# Patient Record
Sex: Female | Born: 1985
Health system: Southern US, Community
[De-identification: ages and names within clinical notes are randomized; demographics above are authoritative.]

## PROBLEM LIST (undated history)

## (undated) DIAGNOSIS — F419 Anxiety disorder, unspecified: Secondary | ICD-10-CM

## (undated) DIAGNOSIS — T7840XA Allergy, unspecified, initial encounter: Secondary | ICD-10-CM

## (undated) HISTORY — DX: Anxiety disorder, unspecified: F41.9

## (undated) HISTORY — DX: Allergy, unspecified, initial encounter: T78.40XA

## (undated) HISTORY — PX: NO PAST SURGERIES: SHX2092

---
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
## 2008-12-27 ENCOUNTER — Ambulatory Visit: Payer: Self-pay | Admitting: Internal Medicine

## 2008-12-27 DIAGNOSIS — R197 Diarrhea, unspecified: Secondary | ICD-10-CM | POA: Insufficient documentation

## 2008-12-27 DIAGNOSIS — R1084 Generalized abdominal pain: Secondary | ICD-10-CM | POA: Insufficient documentation

## 2008-12-29 LAB — CONVERTED CEMR LAB
ALT: 13 units/L (ref 0–35)
AST: 19 units/L (ref 0–37)
BUN: 16 mg/dL (ref 6–23)
Bilirubin, Direct: 0.1 mg/dL (ref 0.0–0.3)
CO2: 27 meq/L (ref 19–32)
Chloride: 108 meq/L (ref 96–112)
Eosinophils Relative: 2.3 % (ref 0.0–5.0)
HCT: 40 % (ref 36.0–46.0)
MCHC: 33.6 g/dL (ref 30.0–36.0)
MCV: 87.6 fL (ref 78.0–100.0)
Platelets: 236 10*3/uL (ref 150.0–400.0)
Potassium: 3.8 meq/L (ref 3.5–5.1)
TSH: 1.45 microintl units/mL (ref 0.35–5.50)
Total Bilirubin: 0.8 mg/dL (ref 0.3–1.2)
Total Protein: 7.3 g/dL (ref 6.0–8.3)
WBC: 6.9 10*3/uL (ref 4.5–10.5)

## 2009-01-12 ENCOUNTER — Ambulatory Visit: Payer: Self-pay | Admitting: Internal Medicine

## 2009-01-12 LAB — CONVERTED CEMR LAB
Fecal Occult Blood: NEGATIVE
OCCULT 2: NEGATIVE
OCCULT 3: NEGATIVE
OCCULT 5: NEGATIVE

## 2009-01-26 ENCOUNTER — Ambulatory Visit: Payer: Self-pay | Admitting: Internal Medicine

## 2009-01-26 DIAGNOSIS — K589 Irritable bowel syndrome without diarrhea: Secondary | ICD-10-CM | POA: Insufficient documentation

## 2013-04-25 ENCOUNTER — Ambulatory Visit (INDEPENDENT_AMBULATORY_CARE_PROVIDER_SITE_OTHER): Payer: BC Managed Care – PPO | Admitting: Family Medicine

## 2013-04-25 VITALS — BP 102/75 | HR 66 | Temp 98.5°F | Resp 18 | Ht 63.0 in | Wt 126.0 lb

## 2013-04-25 DIAGNOSIS — H109 Unspecified conjunctivitis: Secondary | ICD-10-CM

## 2013-04-25 MED ORDER — OFLOXACIN 0.3 % OP SOLN: 2.0000 [drp] | Freq: Four times a day (QID) | OPHTHALMIC | Status: DC

## 2013-04-25 NOTE — Progress Notes (Signed)
[address]   [place], [place]  [address]   [phone]  Subjective:    Patient ID: [name], female    DOB: 08/07/1986, 28 y.o.   MRN: [id number]  HPI This 27 y.o. female presents for evaluation of L eye pain.  Awoke this morning with L pink eye with thick yellow mucous.  No fever/chills/sweats.  No sore throat, rhinorrhea.  Eye feels dry and irritated; no burning.  No excessive tearing.  Feels weird; feels thick.  No foreign body sensation.  No photophobia.  Normal vision.  Wears contacts; took contact out before bedtime.  No pink eye exposures. Works in Airline pilot.    PCP:  Lorain Childes at [facility]   Review of Systems  Constitutional: Negative for chills, diaphoresis and fatigue.  HENT: Positive for rhinorrhea and sneezing. Negative for ear pain and sore throat.   Eyes: Positive for discharge and redness. Negative for photophobia, pain, itching and visual disturbance.  Respiratory: Negative for cough.   Hematological: Negative for adenopathy.   Past Medical History  Diagnosis Date  . Allergy   . Anxiety    No Known Allergies No current outpatient prescriptions on file prior to visit.   No current facility-administered medications on file prior to visit.   History   Social History  . Marital Status: Married    Spouse Name: N/A    Number of Children: N/A  . Years of Education: N/A   Occupational History  . Not on file.   Social History Main Topics  . Smoking status: Never Smoker   . Smokeless tobacco: Not on file  . Alcohol Use: Yes  . Drug Use: No  . Sexual Activity: Yes    Birth Control/ Protection: Pill   Other Topics Concern  . Not on file   Social History Narrative  . No narrative on file       Objective:   Physical Exam  Nursing note and vitals reviewed. Constitutional: She appears well-developed and well-nourished. No distress.  HENT:  Head: Normocephalic and atraumatic.  Mouth/Throat: Oropharynx is clear and moist.  Eyes: EOM are  normal. Pupils are equal, round, and reactive to light. Right eye exhibits no discharge. Left eye exhibits discharge. Right conjunctiva is not injected. Right conjunctiva has no hemorrhage. Left conjunctiva is injected. Left conjunctiva has no hemorrhage. No scleral icterus. Left eye exhibits normal extraocular motion.  Fundoscopic exam:      The right eye shows red reflex.       The left eye shows exudate. The left eye shows red reflex.  Slit lamp exam:      The right eye shows no fluorescein uptake.       The left eye shows no corneal abrasion, no corneal ulcer and no foreign body.  Neurological: She is alert.  Skin: She is not diaphoretic.  Psychiatric: She has a normal mood and affect. Her behavior is normal.       Assessment & Plan:  Conjunctivitis, left eye   1.  L conjunctivitis:  New. Rx for Ocuflox provided to use qid.  No contact lens while eye is red.  RTC for pain, FB sensation, blurred vision.  Meds ordered this encounter  Medications  . norethindrone-ethinyl estradiol (JUNEL FE,GILDESS FE,LOESTRIN FE) 1-20 MG-MCG tablet    Sig: Take 1 tablet by mouth daily.  Marland Kitchen ofloxacin (OCUFLOX) 0.3 % ophthalmic solution    Sig: Place 2 drops into the left eye 4 (four) times daily.    Dispense:  10 mL    Refill:  0

## 2013-04-25 NOTE — Patient Instructions (Addendum)
1.  No contacts while eye is red. 2.  Return to office for development of pain, blurred vision, foreign body sensation.  Conjunctivitis Conjunctivitis is commonly called "pink eye." Conjunctivitis can be caused by bacterial or viral infection, allergies, or injuries. There is usually redness of the lining of the eye, itching, discomfort, and sometimes discharge. There may be deposits of matter along the eyelids. A viral infection usually causes a watery discharge, while a bacterial infection causes a yellowish, thick discharge. Pink eye is very contagious and spreads by direct contact. You may be given antibiotic eyedrops as part of your treatment. Before using your eye medicine, remove all drainage from the eye by washing gently with warm water and cotton balls. Continue to use the medication until you have awakened 2 mornings in a row without discharge from the eye. Do not rub your eye. This increases the irritation and helps spread infection. Use separate towels from other household members. Wash your hands with soap and water before and after touching your eyes. Use cold compresses to reduce pain and sunglasses to relieve irritation from light. Do not wear contact lenses or wear eye makeup until the infection is gone. SEEK MEDICAL CARE IF:   Your symptoms are not better after 3 days of treatment.  You have increased pain or trouble seeing.  The outer eyelids become very red or swollen. Document Released: 09/27/2004 Document Revised: 11/12/2011 Document Reviewed: 08/20/2005 [facility] Patient Information 2014 [place], [place].

## 2015-02-14 LAB — OB RESULTS CONSOLE ABO/RH: RH TYPE: POSITIVE

## 2015-02-14 LAB — OB RESULTS CONSOLE RPR: RPR: NONREACTIVE

## 2015-02-14 LAB — OB RESULTS CONSOLE RUBELLA ANTIBODY, IGM: Rubella: NON-IMMUNE/NOT IMMUNE

## 2015-02-14 LAB — OB RESULTS CONSOLE HIV ANTIBODY (ROUTINE TESTING): HIV: NONREACTIVE

## 2015-02-14 LAB — OB RESULTS CONSOLE HEPATITIS B SURFACE ANTIGEN: Hepatitis B Surface Ag: NEGATIVE

## 2015-02-14 LAB — OB RESULTS CONSOLE ANTIBODY SCREEN: Antibody Screen: NEGATIVE

## 2015-02-28 LAB — OB RESULTS CONSOLE GC/CHLAMYDIA
CHLAMYDIA, DNA PROBE: NEGATIVE
GC PROBE AMP, GENITAL: NEGATIVE

## 2015-08-23 LAB — OB RESULTS CONSOLE GBS: STREP GROUP B AG: NEGATIVE

## 2015-09-04 NOTE — L&D Delivery Note (Signed)
Delivery Note At 10:56 AM a viable and healthy female was delivered via Vaginal, Spontaneous Delivery (Presentation: ; Occiput Anterior).  APGAR: 9, 9; weight  pending.   Placenta status: Intact, Spontaneous.  Cord: 3 vessels with the following complications: None.  Cord pH: na  Anesthesia: Epidural  Episiotomy: None Lacerations:  second Suture Repair: 2.0 3.0 vicryl rapide Est. Blood Loss (mL):    Mom to postpartum.  Baby to Couplet care / Skin to Skin.  Boen Sterbenz J 09/18/2015, 11:19 AM

## 2015-09-18 ENCOUNTER — Inpatient Hospital Stay (HOSPITAL_COMMUNITY): Payer: BLUE CROSS/BLUE SHIELD | Admitting: Anesthesiology

## 2015-09-18 ENCOUNTER — Inpatient Hospital Stay (HOSPITAL_COMMUNITY)
Admission: AD | Admit: 2015-09-18 | Discharge: 2015-09-20 | DRG: 775 | Disposition: A | Discharge status: Home / Self Care | Payer: BLUE CROSS/BLUE SHIELD | Source: Ambulatory Visit | Attending: Obstetrics and Gynecology | Admitting: Obstetrics and Gynecology

## 2015-09-18 ENCOUNTER — Encounter (HOSPITAL_COMMUNITY): Payer: Self-pay

## 2015-09-18 DIAGNOSIS — Z3A39 39 weeks gestation of pregnancy: Secondary | ICD-10-CM | POA: Diagnosis not present

## 2015-09-18 LAB — CBC
HCT: 36.1 % (ref 36.0–46.0)
Hemoglobin: 12.2 g/dL (ref 12.0–15.0)
MCH: 27.1 pg (ref 26.0–34.0)
MCHC: 33.8 g/dL (ref 30.0–36.0)
MCV: 80 fL (ref 78.0–100.0)
PLATELETS: 282 10*3/uL (ref 150–400)
RBC: 4.51 MIL/uL (ref 3.87–5.11)
RDW: 14 % (ref 11.5–15.5)
WBC: 10.5 10*3/uL (ref 4.0–10.5)

## 2015-09-18 LAB — ABO/RH: ABO/RH(D): O POS

## 2015-09-18 LAB — RPR: RPR Ser Ql: NONREACTIVE

## 2015-09-18 LAB — TYPE AND SCREEN
ABO/RH(D): O POS
ANTIBODY SCREEN: NEGATIVE

## 2015-09-18 MED ORDER — OXYCODONE-ACETAMINOPHEN 5-325 MG PO TABS: 1.0000 | ORAL_TABLET | ORAL | Status: DC | PRN

## 2015-09-18 MED ORDER — LIDOCAINE HCL (PF) 1 % IJ SOLN
30.0000 mL | INTRAMUSCULAR | Status: DC | PRN
  Filled 2015-09-18: qty 30

## 2015-09-18 MED ORDER — FENTANYL 2.5 MCG/ML BUPIVACAINE 1/10 % EPIDURAL INFUSION (WH - ANES)
INTRAMUSCULAR | Status: AC
  Filled 2015-09-18: qty 125

## 2015-09-18 MED ORDER — ACETAMINOPHEN 325 MG PO TABS: 650.0000 mg | ORAL_TABLET | ORAL | Status: DC | PRN

## 2015-09-18 MED ORDER — METHYLERGONOVINE MALEATE 0.2 MG/ML IJ SOLN: 0.2000 mg | INTRAMUSCULAR | Status: DC | PRN

## 2015-09-18 MED ORDER — LIDOCAINE HCL (PF) 1 % IJ SOLN
INTRAMUSCULAR | Status: DC | PRN
  Administered 2015-09-18: 3 mL via EPIDURAL
  Administered 2015-09-18: 2 mL via EPIDURAL
  Administered 2015-09-18: 5 mL via EPIDURAL

## 2015-09-18 MED ORDER — LACTATED RINGERS IV SOLN
2.5000 [IU]/h | INTRAVENOUS | Status: DC
  Filled 2015-09-18: qty 4

## 2015-09-18 MED ORDER — FENTANYL 2.5 MCG/ML BUPIVACAINE 1/10 % EPIDURAL INFUSION (WH - ANES)
14.0000 mL/h | INTRAMUSCULAR | Status: DC | PRN
Start: 2015-09-18 — End: 2015-09-18
  Administered 2015-09-18: 14 mL/h via EPIDURAL

## 2015-09-18 MED ORDER — SENNOSIDES-DOCUSATE SODIUM 8.6-50 MG PO TABS
2.0000 | ORAL_TABLET | ORAL | Status: DC
  Administered 2015-09-19: 2 via ORAL
  Filled 2015-09-18 (×2): qty 2

## 2015-09-18 MED ORDER — PRENATAL MULTIVITAMIN CH
1.0000 | ORAL_TABLET | Freq: Every day | ORAL | Status: DC
  Administered 2015-09-18 – 2015-09-20 (×3): 1 via ORAL
  Filled 2015-09-18 (×3): qty 1

## 2015-09-18 MED ORDER — CITRIC ACID-SODIUM CITRATE 334-500 MG/5ML PO SOLN: 30.0000 mL | ORAL | Status: DC | PRN

## 2015-09-18 MED ORDER — ONDANSETRON HCL 4 MG/2ML IJ SOLN: 4.0000 mg | Freq: Four times a day (QID) | INTRAMUSCULAR | Status: DC | PRN

## 2015-09-18 MED ORDER — SIMETHICONE 80 MG PO CHEW: 80.0000 mg | CHEWABLE_TABLET | ORAL | Status: DC | PRN

## 2015-09-18 MED ORDER — IBUPROFEN 600 MG PO TABS
600.0000 mg | ORAL_TABLET | Freq: Four times a day (QID) | ORAL | Status: DC
  Administered 2015-09-18 – 2015-09-20 (×8): 600 mg via ORAL
  Filled 2015-09-18 (×9): qty 1

## 2015-09-18 MED ORDER — LACTATED RINGERS IV SOLN: 500.0000 mL | INTRAVENOUS | Status: DC | PRN

## 2015-09-18 MED ORDER — METHYLERGONOVINE MALEATE 0.2 MG PO TABS: 0.2000 mg | ORAL_TABLET | ORAL | Status: DC | PRN

## 2015-09-18 MED ORDER — FLEET ENEMA 7-19 GM/118ML RE ENEM: 1.0000 | ENEMA | RECTAL | Status: DC | PRN

## 2015-09-18 MED ORDER — ONDANSETRON HCL 4 MG/2ML IJ SOLN: 4.0000 mg | INTRAMUSCULAR | Status: DC | PRN

## 2015-09-18 MED ORDER — EPHEDRINE 5 MG/ML INJ
10.0000 mg | INTRAVENOUS | Status: DC | PRN
Start: 2015-09-18 — End: 2015-09-18
  Filled 2015-09-18: qty 2

## 2015-09-18 MED ORDER — DIBUCAINE 1 % RE OINT: 1.0000 "application " | TOPICAL_OINTMENT | RECTAL | Status: DC | PRN

## 2015-09-18 MED ORDER — ZOLPIDEM TARTRATE 5 MG PO TABS: 5.0000 mg | ORAL_TABLET | Freq: Every evening | ORAL | Status: DC | PRN

## 2015-09-18 MED ORDER — LACTATED RINGERS IV SOLN
INTRAVENOUS | Status: DC
  Administered 2015-09-18: 06:00:00 via INTRAVENOUS

## 2015-09-18 MED ORDER — LANOLIN HYDROUS EX OINT: TOPICAL_OINTMENT | CUTANEOUS | Status: DC | PRN

## 2015-09-18 MED ORDER — PHENYLEPHRINE 40 MCG/ML (10ML) SYRINGE FOR IV PUSH (FOR BLOOD PRESSURE SUPPORT)
PREFILLED_SYRINGE | INTRAVENOUS | Status: AC
  Filled 2015-09-18: qty 10

## 2015-09-18 MED ORDER — ONDANSETRON HCL 4 MG PO TABS: 4.0000 mg | ORAL_TABLET | ORAL | Status: DC | PRN

## 2015-09-18 MED ORDER — PHENYLEPHRINE 40 MCG/ML (10ML) SYRINGE FOR IV PUSH (FOR BLOOD PRESSURE SUPPORT)
80.0000 ug | PREFILLED_SYRINGE | INTRAVENOUS | Status: DC | PRN
  Filled 2015-09-18: qty 2

## 2015-09-18 MED ORDER — WITCH HAZEL-GLYCERIN EX PADS: 1.0000 "application " | MEDICATED_PAD | CUTANEOUS | Status: DC | PRN

## 2015-09-18 MED ORDER — OXYCODONE-ACETAMINOPHEN 5-325 MG PO TABS: 2.0000 | ORAL_TABLET | ORAL | Status: DC | PRN

## 2015-09-18 MED ORDER — BENZOCAINE-MENTHOL 20-0.5 % EX AERO
1.0000 "application " | INHALATION_SPRAY | CUTANEOUS | Status: DC | PRN
  Administered 2015-09-18: 1 via TOPICAL
  Filled 2015-09-18 (×2): qty 56

## 2015-09-18 MED ORDER — TETANUS-DIPHTH-ACELL PERTUSSIS 5-2.5-18.5 LF-MCG/0.5 IM SUSP: 0.5000 mL | Freq: Once | INTRAMUSCULAR | Status: DC

## 2015-09-18 MED ORDER — DIPHENHYDRAMINE HCL 50 MG/ML IJ SOLN: 12.5000 mg | INTRAMUSCULAR | Status: DC | PRN

## 2015-09-18 MED ORDER — OXYTOCIN BOLUS FROM INFUSION
500.0000 mL | INTRAVENOUS | Status: DC
  Administered 2015-09-18: 500 mL via INTRAVENOUS

## 2015-09-18 MED ORDER — DIPHENHYDRAMINE HCL 25 MG PO CAPS
25.0000 mg | ORAL_CAPSULE | Freq: Four times a day (QID) | ORAL | Status: DC | PRN
Start: 2015-09-18 — End: 2015-09-20

## 2015-09-18 NOTE — Progress Notes (Signed)
Patient ID: [name], female   DOB: Nov 18, 1985, 30 y.o.   MRN: [id number] Called for FHR deceleration Noted a questionable period where FHT in 6190s FHT category 1 prior to decel Currently category 1 tracing with BTBV >5 and accels Will admit for routine labor management and get epidural

## 2015-09-18 NOTE — Anesthesia Postprocedure Evaluation (Signed)
Anesthesia Post Note  Patient: [name]  Procedure(s) Performed: * No procedures listed *  Patient location during evaluation: Mother Baby Anesthesia Type: Epidural Level of consciousness: awake Pain management: pain level controlled Vital Signs Assessment: post-procedure vital signs reviewed and stable Respiratory status: spontaneous breathing Cardiovascular status: stable Postop Assessment: no headache, no backache, epidural receding, patient able to bend at knees, adequate PO intake and no signs of nausea or vomiting Anesthetic complications: no    Last Vitals:  Filed Vitals:   09/18/15 1300 09/18/15 1430  BP: 114/57 118/62  Pulse: 57 72  Temp: 36.7 C 37.4 C  Resp: 18 20    Last Pain:  Filed Vitals:   09/18/15 1458  PainSc: 2                  Zurich Carreno

## 2015-09-18 NOTE — MAU Note (Signed)
Was 4 cm at office on Wednesday.  Started contracting few hours ago, every 10 min , then 5 min, closer now.  Blood tinge mucus.  Baby moving well.  No leaking.

## 2015-09-18 NOTE — Anesthesia Procedure Notes (Signed)
Epidural Patient location during procedure: OB  Staffing Anesthesiologist: Tieler Cournoyer EDWARD Performed by: anesthesiologist   Preanesthetic Checklist Completed: patient identified, pre-op evaluation, timeout performed, IV checked, risks and benefits discussed and monitors and equipment checked  Epidural Patient position: sitting Prep: DuraPrep Patient monitoring: blood pressure and continuous pulse ox Approach: midline Location: L3-L4 Injection technique: LOR air  Needle:  Needle type: Tuohy  Needle gauge: 17 G Needle length: 9 cm Needle insertion depth: 6 cm Catheter size: 19 Gauge Catheter at skin depth: 10 cm Test dose: negative and Other (1% Lidocaine)  Additional Notes Patient identified.  Risk benefits discussed including failed block, incomplete pain control, headache, nerve damage, paralysis, blood pressure changes, nausea, vomiting, reactions to medication both toxic or allergic, and postpartum back pain.  Patient expressed understanding and wished to proceed.  All questions were answered.  Sterile technique used throughout procedure and epidural site dressed with sterile barrier dressing. No paresthesia or other complications noted. The patient did not experience any signs of intravascular injection such as tinnitus or metallic taste in mouth nor signs of intrathecal spread such as rapid motor block. Please see nursing notes for vital signs. Reason for block:procedure for pain   

## 2015-09-18 NOTE — Anesthesia Preprocedure Evaluation (Signed)
Anesthesia Evaluation  Patient identified by MRN, date of birth, ID band Patient awake    Reviewed: Allergy & Precautions, NPO status , Patient's Chart, lab work & pertinent test results  Airway Mallampati: II  TM Distance: >3 FB Neck ROM: Full    Dental  (+) Teeth Intact, Dental Advisory Given   Pulmonary neg pulmonary ROS,    Pulmonary exam normal breath sounds clear to auscultation       Cardiovascular Exercise Tolerance: Good negative cardio ROS Normal cardiovascular exam Rhythm:Regular Rate:Normal     Neuro/Psych PSYCHIATRIC DISORDERS Anxiety negative neurological ROS     GI/Hepatic negative GI ROS, Neg liver ROS,   Endo/Other  Obesity   Renal/GU negative Renal ROS     Musculoskeletal scoliosis   Abdominal   Peds  Hematology negative hematology ROS (+) Plt 282k   Anesthesia Other Findings Day of surgery medications reviewed with the patient.  Reproductive/Obstetrics (+) Pregnancy                             Anesthesia Physical Anesthesia Plan  ASA: II  Anesthesia Plan: Epidural   Post-op Pain Management:    Induction:   Airway Management Planned:   Additional Equipment:   Intra-op Plan:   Post-operative Plan:   Informed Consent: I have reviewed the patients History and Physical, chart, labs and discussed the procedure including the risks, benefits and alternatives for the proposed anesthesia with the patient or authorized representative who has indicated his/her understanding and acceptance.   Dental advisory given  Plan Discussed with:   Anesthesia Plan Comments: (Patient identified. Risks/Benefits/Options discussed with patient including but not limited to bleeding, infection, nerve damage, paralysis, failed block, incomplete pain control, headache, blood pressure changes, nausea, vomiting, reactions to medication both or allergic, itching and postpartum back pain.  Confirmed with bedside nurse the patient's most recent platelet count. Confirmed with patient that they are not currently taking any anticoagulation, have any bleeding history or any family history of bleeding disorders. Patient expressed understanding and wished to proceed. All questions were answered. )        Anesthesia Quick Evaluation

## 2015-09-18 NOTE — Lactation Note (Signed)
This note was copied from the chart of [name] Mcmaster. Lactation Consultation Note  Patient Name: [name] Jimmerson RUEAV'WToday's Date: 09/18/2015 Reason for consult: Initial assessment  Baby 4 hours old. Mom reports that baby is hard to latch. Assisted mom with positioning herself using pillows and latching baby in football position. Demonstrated hand expression with no colostrum present. Discussed normal progression of milk coming to volume. Demonstrated the "teacup" hold for her nipple, and baby latched deeply, suckling rhythmically with no swallows noted. Baby only suckled for 4 minutes, and then fell asleep. Demonstrated how to perform chin tug to flange lower lip and discussed with parents that baby tired and transitioning at this point. Enc mom to keep baby on her chest STS, and to continue to offer lots of STS and nurse with cues.   Parents given LC brochure, aware of OP/BFSG and LC phone line assistance after D/C.  Maternal Data    Feeding Feeding Type: Breast Fed Length of feed: 4 min  LATCH Score/Interventions Latch: Grasps breast easily, tongue down, lips flanged, rhythmical sucking.  Audible Swallowing: None Intervention(s): Skin to skin;Hand expression  Type of Nipple: Flat Intervention(s): No intervention needed  Comfort (Breast/Nipple): Soft / non-tender     Hold (Positioning): Assistance needed to correctly position infant at breast and maintain latch.  LATCH Score: 6  Lactation Tools Discussed/Used     Consult Status Consult Status: Follow-up Date: 09/19/15 Follow-up type: In-patient    Geralynn OchsWILLIARD, Keithan Dileonardo 09/18/2015, 3:21 PM

## 2015-09-18 NOTE — Progress Notes (Signed)
Byrd HesselbachJulia M Liles is a 30 y.o. G1P0 at 3762w1d by LMP admitted for active labor  Subjective: occ pressure  Objective: BP 112/65 mmHg  Pulse 62  Temp(Src) 98.4 F (36.9 C) (Oral)  Ht 5\' 2"  (1.575 m)  Wt 76.658 kg (169 lb)  BMI 30.90 kg/m2  SpO2 100%      FHT:  FHR: 125 bpm, variability: moderate,  accelerations:  Present,  decelerations:  Absent UC:   regular, every 3 minutes SVE:   Dilation: 10 Effacement (%): 100 Station: 0, +1 Exam by:: patti moore rn  Labs: Lab Results  Component Value Date   WBC 10.5 09/18/2015   HGB 12.2 09/18/2015   HCT 36.1 09/18/2015   MCV 80.0 09/18/2015   PLT 282 09/18/2015    Assessment / Plan: Spontaneous labor, progressing normally  Labor: Progressing normally Preeclampsia:  no signs or symptoms of toxicity Fetal Wellbeing:  Category I Pain Control:  Epidural I/D:  n/a Anticipated MOD:  NSVD  Christia Domke J 09/18/2015, 8:48 AM

## 2015-09-18 NOTE — H&P (Signed)
[name] is a 30 y.o. female presenting for labor evaluation.  Maternal Medical History:  Reason for admission: Contractions.   Contractions: Onset was 1-2 hours ago.   Frequency: regular.   Perceived severity is moderate.    Fetal activity: Perceived fetal activity is normal.   Last perceived fetal movement was within the past hour.    Prenatal complications: no prenatal complications Prenatal Complications - Diabetes: none.    OB History    Gravida Para Term Preterm AB TAB SAB Ectopic Multiple Living   1              Past Medical History  Diagnosis Date  . Allergy   . Anxiety    Past Surgical History  Procedure Laterality Date  . No past surgeries     Family History: family history includes Glaucoma in her father; Hepatitis C in her father. Social History:  reports that she has never smoked. She does not have any smokeless tobacco history on file. She reports that she drinks alcohol. She reports that she does not use illicit drugs.   Prenatal Transfer Tool  Maternal Diabetes: No Genetic Screening: Normal Maternal Ultrasounds/Referrals: Normal Fetal Ultrasounds or other Referrals:  None Maternal Substance Abuse:  No Significant Maternal Medications:  None Significant Maternal Lab Results:  None Other Comments:  None  Review of Systems  Constitutional: Negative.   All other systems reviewed and are negative.     There were no vitals taken for this visit. Maternal Exam:  Uterine Assessment: Contraction strength is moderate.  Contraction frequency is irregular.   Abdomen: Patient reports no abdominal tenderness. Fetal presentation: vertex  Introitus: Normal vulva. Normal vagina.  Ferning test: negative.  Nitrazine test: negative. Amniotic fluid character: not assessed.  Pelvis: adequate for delivery.   Cervix: Cervix evaluated by digital exam.     Physical Exam  Nursing note and vitals reviewed. Constitutional: She appears well-developed and  well-nourished.  HENT:  Head: Normocephalic and atraumatic.  Neck: Normal range of motion. Neck supple.  Cardiovascular: Normal rate and regular rhythm.   Respiratory: Effort normal and breath sounds normal.  GI: Soft. Bowel sounds are normal.  Genitourinary: Vagina normal and uterus normal.  Musculoskeletal: Normal range of motion.  Skin: Skin is warm and dry.  Psychiatric: She has a normal mood and affect.    Prenatal labs: ABO, Rh: O/Positive/-- (06/13 0000) Antibody: Negative (06/13 0000) Rubella: Nonimmune (06/13 0000) RPR: Nonreactive (06/13 0000)  HBsAg: Negative (06/13 0000)  HIV: Non-reactive (06/13 0000)  GBS: Negative (12/20 0000)   Assessment/Plan: Term IUP Active Labor Admit Epidural   Deano Tomaszewski J 09/18/2015, 6:11 AM

## 2015-09-19 LAB — CBC
HCT: 31.2 % — ABNORMAL LOW (ref 36.0–46.0)
HEMOGLOBIN: 10.4 g/dL — AB (ref 12.0–15.0)
MCH: 26.9 pg (ref 26.0–34.0)
MCHC: 33.3 g/dL (ref 30.0–36.0)
MCV: 80.6 fL (ref 78.0–100.0)
PLATELETS: 261 10*3/uL (ref 150–400)
RBC: 3.87 MIL/uL (ref 3.87–5.11)
RDW: 14.4 % (ref 11.5–15.5)
WBC: 14 10*3/uL — AB (ref 4.0–10.5)

## 2015-09-19 NOTE — Lactation Note (Signed)
This note was copied from the chart of [name]. Lactation Consultation Note Baby is 2227 hours old and has not latched to breast more than a few minutes.  He had circumcision done 2 hours ago and is very sleepy.  Mom can hand express a drop of colostrum.  Baby holds breast in mouth with no suck elicited.  20 mm nipple shield applied and baby did suck on and off for 5 minutes.  A few swallows noted and one drop of colostrum in shield.  Nipple pinched slightly so 24 mm shield brought for next feeding.  DEBP initiated.  Instructed mom to give small amounts back by finger or call for assist with foley cup if more obtained.  Mom knows to feed with cues and post pump/hand express every 3 hours.  Encouraged to call with concerns/assist prn.  Patient Name: Jessica Charlette CaffeyJulia Clewis ZOXWR'UToday's Date: 09/19/2015 Reason for consult: Follow-up assessment;Difficult latch   Maternal Data    Feeding Feeding Type: Breast Fed Length of feed: 5 min  LATCH Score/Interventions Latch: Repeated attempts needed to sustain latch, nipple held in mouth throughout feeding, stimulation needed to elicit sucking reflex. Intervention(s): Adjust position;Assist with latch;Breast massage;Breast compression  Audible Swallowing: A few with stimulation  Type of Nipple: Flat  Comfort (Breast/Nipple): Soft / non-tender     Hold (Positioning): Assistance needed to correctly position infant at breast and maintain latch. Intervention(s): Breastfeeding basics reviewed;Support Pillows;Position options;Skin to skin  LATCH Score: 6  Lactation Tools Discussed/Used Tools: Nipple Shields Nipple shield size: 20 Pump Review: Setup, frequency, and cleaning;Milk Storage Initiated by:: LC Date initiated:: 09/19/15   Consult Status Consult Status: Follow-up Date: 09/20/15 Follow-up type: In-patient    Huston FoleyMOULDEN, Rozell Theiler S 09/19/2015, 2:56 PM

## 2015-09-19 NOTE — Progress Notes (Signed)
MOB was referred for history of depression/anxiety.  Referral is screened out by Clinical Social Worker because none of the following criteria appear to apply: -History of anxiety/depression during this pregnancy, or of post-partum depression. - Diagnosis of anxiety and/or depression within last 3 years (since prior to 2010) or -MOB's symptoms are currently being treated with medication and/or therapy.  Please contact the Clinical Social Worker if needs arise or upon MOB request.  

## 2015-09-19 NOTE — Progress Notes (Signed)
Patient ID: [name], female   DOB: 05/05/1986, 30 y.o.   MRN: [id number] PPD # 1 SVD  S:  Reports feeling well.             Tolerating po/ No nausea or vomiting             Bleeding is light             Pain controlled with ibuprofen (OTC)             Up ad lib / ambulatory / voiding without difficulties    Newborn  Information for the patient's newborn:  [name], [name] [[id number]][[id number]]  female  breast feeding  / Circumcision planning   O:  A & O x 3, in no apparent distress              VS:  Filed Vitals:   09/18/15 1300 09/18/15 1430 09/18/15 1830 09/19/15 0645  BP: 114/57 118/62 105/56 121/59  Pulse: 57 72 57 80  Temp: 98 F (36.7 C) 99.3 F (37.4 C) 98.4 F (36.9 C) 98.3 F (36.8 C)  TempSrc: Oral Oral Oral   Resp: 18 20 20 18   Height:      Weight:      SpO2: 99%       LABS:  Recent Labs  09/18/15 0612 09/19/15 0555  WBC 10.5 14.0*  HGB 12.2 10.4*  HCT 36.1 31.2*  PLT 282 261    Blood type: O POS (01/15 0612)  Rubella: Nonimmune (06/13 0000)   I&O: I/O last 3 completed shifts: In: -  Out: 576 [Urine:100; Blood:476]             Lungs: Clear and unlabored  Heart: regular rate and rhythm / no murmurs  Abdomen: soft, non-tender, non-distended             Fundus: firm, non-tender, U-1  Perineum: 2nd degree repair healing well  Lochia: minimal  Extremities: No edema, no calf pain or tenderness, No Homans    A/P: PPD # 1  30 y.o., G1P1001   Principal Problem:    Postpartum care following vaginal delivery (1/15)   Doing well - stable status  Routine post partum orders  Anticipate discharge tomorrow    [name], [name], M, MSN, CNM 09/19/2015, 8:53 AM

## 2015-09-19 NOTE — Lactation Note (Signed)
This note was copied from the chart of [name]. Lactation Consultation Note Baby doing better at BF, had some cluster feeding tonight. Having good output. Monitoring I&O.  Patient Name: Jessica Charlette CaffeyJulia Philipson NWGNF'AToday's Date: 09/19/2015 Reason for consult: Follow-up assessment   Maternal Data    Feeding    LATCH Score/Interventions                      Lactation Tools Discussed/Used     Consult Status Consult Status: Follow-up Date: 09/19/15 Follow-up type: In-patient    Charyl DancerCARVER, Tyrell Seifer G 09/19/2015, 7:11 AM

## 2015-09-20 MED ORDER — TETANUS-DIPHTH-ACELL PERTUSSIS 5-2.5-18.5 LF-MCG/0.5 IM SUSP
0.5000 mL | Freq: Once | INTRAMUSCULAR | Status: AC
  Administered 2015-09-20: 0.5 mL via INTRAMUSCULAR
  Filled 2015-09-20: qty 0.5

## 2015-09-20 MED ORDER — IBUPROFEN 600 MG PO TABS: 600.0000 mg | ORAL_TABLET | Freq: Four times a day (QID) | ORAL | Status: DC

## 2015-09-20 MED ORDER — MEASLES, MUMPS & RUBELLA VAC ~~LOC~~ INJ
0.5000 mL | INJECTION | Freq: Once | SUBCUTANEOUS | Status: AC
  Administered 2015-09-20: 0.5 mL via SUBCUTANEOUS
  Filled 2015-09-20: qty 0.5

## 2015-09-20 MED ORDER — OXYCODONE-ACETAMINOPHEN 5-325 MG PO TABS: 1.0000 | ORAL_TABLET | ORAL | Status: DC | PRN

## 2015-09-20 NOTE — Discharge Summary (Signed)
Obstetric Discharge Summary  Reason for Admission: onset of labor Prenatal Procedures: none Intrapartum Procedures: spontaneous vaginal delivery Postpartum Procedures: none Complications-Operative and Postpartum: 2nd degree perineal laceration HEMOGLOBIN  Date Value Ref Range Status  09/19/2015 10.4* 12.0 - 15.0 g/dL Final   HCT  Date Value Ref Range Status  09/19/2015 31.2* 36.0 - 46.0 % Final    Physical Exam:  General: alert, cooperative and no distress Lochia: appropriate Uterine Fundus: firm Incision: healing well DVT Evaluation: No evidence of DVT seen on physical exam.  Discharge Diagnoses: Term Pregnancy-delivered  Discharge Information: Date: 09/20/2015 Activity: pelvic rest Diet: routine Medications: PNV, Ibuprofen and Percocet Condition: stable Instructions: refer to practice specific booklet Discharge to: home Follow-up Information    Follow up with Lenoard Aden, MD. Schedule an appointment as soon as possible for a visit in 6 weeks.   Specialty:  Obstetrics and Gynecology   Contact information:   [address] [place] [address] [phone]       Newborn Data: Live born female  Birth Weight: 6 lb 8.6 oz (2965 g) APGAR: 9, 9  Home with mother.  [name] 09/20/2015, 10:24 AM

## 2015-09-20 NOTE — Lactation Note (Signed)
This note was copied from the chart of Jessica Sharis Keeran. Lactation Consultation Note Mom w/flat nipples and breast filling wearing #24/20 NS. Has shells but not wearing them. Encouraged to wear them to assist in everting nipple out. Has edema to nipples. Reverse pressure to assist in removing edema to areola. Fitted #20 NS fits better than #24. Has DEBP at home, encouraged to post pump. Encouraged to pump breast at this time to soften breast. Engorgement teaching done. Mom appears timid and quiet. Asked if she understood directions. Stated she liked the pumping. Kept encourageing to keep flanges close to mom for adequate seal. Has comfort gels for red nipples. Encouraged to hand express. Gave ICE pack to place on breast to assist in softening. Encouraged to soften breast but BF, massaging, and post pumping. Mom wanted to call to make f/u appt. W/ LC out pt. Services. Explained the importance of f/u appt.  Patient Name: [name] [id number]'W Date: 09/20/2015 Reason for consult: Follow-up assessment;Breast/nipple pain   Maternal Data    Feeding Feeding Type: Breast Fed Length of feed: 45 min  LATCH Score/Interventions    Intervention(s): Hand expression;Skin to skin  Type of Nipple: Flat Intervention(s): Shells;Double electric pump;Reverse pressure  Comfort (Breast/Nipple): Filling, red/small blisters or bruises, mild/mod discomfort  Problem noted: Mild/Moderate discomfort Interventions (Mild/moderate discomfort): Breast shields;Comfort gels;Post-pump;Reverse pressue;Hand expression;Hand massage  Intervention(s): Position options;Skin to skin;Support Pillows;Breastfeeding basics reviewed     Lactation Tools Discussed/Used Tools: Nipple Dorris Carnes;Shells;Pump Nipple shield size: 20;24 Shell Type: Inverted Breast pump type: Double-Electric Breast Pump   Consult Status Consult Status: Complete Date: 09/20/15    Charyl Dancer 09/20/2015, 11:53 AM

## 2015-09-20 NOTE — Progress Notes (Signed)
PPD 2 SVD with 2nd degree repair  S:  Reports feeling well             Tolerating po/ No nausea or vomiting             Bleeding is light             Pain controlled with motrin and some percocet             Up ad lib / ambulatory / voiding QS  Newborn breast feeding  O:               VS: BP 112/67 mmHg  Pulse 90  Temp(Src) 98.3 F (36.8 C) (Oral)  Resp 18  Ht '5\' 2"'$  (1.575 m)  Wt 76.658 kg (169 lb)  BMI 30.90 kg/m2  SpO2 99%  Breastfeeding? Unknown   LABS:              Recent Labs  09/18/15 0612 09/19/15 0555  WBC 10.5 14.0*  HGB 12.2 10.4*  PLT 282 261               Blood type: --/--/O POS, O POS (01/15 0612)  Rubella: Nonimmune (06/13 0000)   - offer MMR prior to DC                             Physical Exam:             Alert and oriented X3  Abdomen: soft, non-tender, non-distended              Fundus: firm, non-tender, U-1  Perineum: mild edema  Lochia: light  Extremities: no edema, no calf pain or tenderness    A: PPD # 2   Doing well - stable status  P: Routine post partum orders  DC home - WOB booklet - instructions reviewed  [name] CNM, MSN, [facility] 09/20/2015, 9:47 AM

## 2016-11-07 DIAGNOSIS — Z01419 Encounter for gynecological examination (general) (routine) without abnormal findings: Secondary | ICD-10-CM | POA: Diagnosis not present

## 2016-11-07 DIAGNOSIS — Z6823 Body mass index (BMI) 23.0-23.9, adult: Secondary | ICD-10-CM | POA: Diagnosis not present

## 2016-12-13 DIAGNOSIS — L298 Other pruritus: Secondary | ICD-10-CM | POA: Diagnosis not present

## 2017-01-16 DIAGNOSIS — L298 Other pruritus: Secondary | ICD-10-CM | POA: Diagnosis not present

## 2017-05-13 DIAGNOSIS — J020 Streptococcal pharyngitis: Secondary | ICD-10-CM | POA: Diagnosis not present

## 2017-05-13 DIAGNOSIS — J029 Acute pharyngitis, unspecified: Secondary | ICD-10-CM | POA: Diagnosis not present

## 2017-05-13 DIAGNOSIS — Z6822 Body mass index (BMI) 22.0-22.9, adult: Secondary | ICD-10-CM | POA: Diagnosis not present

## 2017-06-06 DIAGNOSIS — Z23 Encounter for immunization: Secondary | ICD-10-CM | POA: Diagnosis not present

## 2017-11-11 DIAGNOSIS — J069 Acute upper respiratory infection, unspecified: Secondary | ICD-10-CM | POA: Diagnosis not present

## 2017-11-11 DIAGNOSIS — R05 Cough: Secondary | ICD-10-CM | POA: Diagnosis not present

## 2017-11-11 DIAGNOSIS — H6121 Impacted cerumen, right ear: Secondary | ICD-10-CM | POA: Diagnosis not present

## 2018-01-16 DIAGNOSIS — Z01419 Encounter for gynecological examination (general) (routine) without abnormal findings: Secondary | ICD-10-CM | POA: Diagnosis not present

## 2018-05-20 DIAGNOSIS — Z3201 Encounter for pregnancy test, result positive: Secondary | ICD-10-CM | POA: Diagnosis not present

## 2018-06-02 DIAGNOSIS — Z3201 Encounter for pregnancy test, result positive: Secondary | ICD-10-CM | POA: Diagnosis not present

## 2018-06-10 DIAGNOSIS — Z3481 Encounter for supervision of other normal pregnancy, first trimester: Secondary | ICD-10-CM | POA: Diagnosis not present

## 2018-06-10 LAB — OB RESULTS CONSOLE RUBELLA ANTIBODY, IGM: Rubella: IMMUNE

## 2018-06-10 LAB — OB RESULTS CONSOLE RPR: RPR: NONREACTIVE

## 2018-06-10 LAB — OB RESULTS CONSOLE HIV ANTIBODY (ROUTINE TESTING): HIV: NONREACTIVE

## 2018-06-10 LAB — OB RESULTS CONSOLE HEPATITIS B SURFACE ANTIGEN: Hepatitis B Surface Ag: NEGATIVE

## 2018-06-17 DIAGNOSIS — Z3481 Encounter for supervision of other normal pregnancy, first trimester: Secondary | ICD-10-CM | POA: Diagnosis not present

## 2018-06-17 LAB — OB RESULTS CONSOLE GC/CHLAMYDIA
Chlamydia: NEGATIVE
Gonorrhea: NEGATIVE

## 2018-08-01 DIAGNOSIS — J069 Acute upper respiratory infection, unspecified: Secondary | ICD-10-CM | POA: Diagnosis not present

## 2018-08-01 DIAGNOSIS — Z6822 Body mass index (BMI) 22.0-22.9, adult: Secondary | ICD-10-CM | POA: Diagnosis not present

## 2018-08-01 DIAGNOSIS — R05 Cough: Secondary | ICD-10-CM | POA: Diagnosis not present

## 2018-09-03 NOTE — L&D Delivery Note (Signed)
Operative Delivery Note At 1:28 PM a viable and healthy female was delivered via .  Presentation: vertex; Position: Left,, Occiput,, Anterior; Station: +3.  Verbal consent: obtained from patient.  Risks and benefits discussed in detail.  Risks include, but are not limited to the risks of anesthesia, bleeding, infection, damage to maternal tissues, fetal cephalhematoma.  There is also the risk of inability to effect vaginal delivery of the head, or shoulder dystocia that cannot be resolved by established maneuvers, leading to the need for emergency cesarean section.  Indication: fetal bradycardia  APGAR: 8, 9; weight pending .   Placenta status: spontaneous ,intact .   Cord:  with the following complications: none.  Cord pH: na  Anesthesia:  epidural Instruments: kiwi x one pull Episiotomy:  na Lacerations:  second Suture Repair: 3.0 vicryl rapide Est. Blood Loss (mL):  100  Mom to postpartum.  Baby to Couplet care / Skin to Skin.  Mylasia Vorhees J 01/04/2019, 1:42 PM

## 2018-09-09 DIAGNOSIS — Z0489 Encounter for examination and observation for other specified reasons: Secondary | ICD-10-CM | POA: Diagnosis not present

## 2018-10-23 DIAGNOSIS — Z23 Encounter for immunization: Secondary | ICD-10-CM | POA: Diagnosis not present

## 2018-12-15 DIAGNOSIS — Z3482 Encounter for supervision of other normal pregnancy, second trimester: Secondary | ICD-10-CM | POA: Diagnosis not present

## 2018-12-15 DIAGNOSIS — Z3483 Encounter for supervision of other normal pregnancy, third trimester: Secondary | ICD-10-CM | POA: Diagnosis not present

## 2018-12-16 DIAGNOSIS — Z3685 Encounter for antenatal screening for Streptococcus B: Secondary | ICD-10-CM | POA: Diagnosis not present

## 2018-12-25 ENCOUNTER — Other Ambulatory Visit: Payer: Self-pay | Admitting: Obstetrics and Gynecology

## 2018-12-25 DIAGNOSIS — Z3A37 37 weeks gestation of pregnancy: Secondary | ICD-10-CM | POA: Diagnosis not present

## 2018-12-25 DIAGNOSIS — O3663X0 Maternal care for excessive fetal growth, third trimester, not applicable or unspecified: Secondary | ICD-10-CM | POA: Diagnosis not present

## 2019-01-04 ENCOUNTER — Inpatient Hospital Stay (HOSPITAL_COMMUNITY): Payer: 59 | Admitting: Anesthesiology

## 2019-01-04 ENCOUNTER — Inpatient Hospital Stay (HOSPITAL_COMMUNITY)
Admission: AD | Admit: 2019-01-04 | Discharge: 2019-01-05 | DRG: 807 | Disposition: A | Discharge status: Home / Self Care | Payer: 59 | Attending: Obstetrics and Gynecology | Admitting: Obstetrics and Gynecology

## 2019-01-04 ENCOUNTER — Encounter (HOSPITAL_COMMUNITY): Payer: Self-pay | Admitting: *Deleted

## 2019-01-04 ENCOUNTER — Inpatient Hospital Stay (HOSPITAL_COMMUNITY): Payer: 59

## 2019-01-04 ENCOUNTER — Other Ambulatory Visit: Payer: Self-pay

## 2019-01-04 DIAGNOSIS — Z3490 Encounter for supervision of normal pregnancy, unspecified, unspecified trimester: Secondary | ICD-10-CM | POA: Diagnosis present

## 2019-01-04 DIAGNOSIS — O99824 Streptococcus B carrier state complicating childbirth: Principal | ICD-10-CM | POA: Diagnosis present

## 2019-01-04 DIAGNOSIS — Z3A39 39 weeks gestation of pregnancy: Secondary | ICD-10-CM | POA: Diagnosis not present

## 2019-01-04 DIAGNOSIS — O26893 Other specified pregnancy related conditions, third trimester: Secondary | ICD-10-CM | POA: Diagnosis not present

## 2019-01-04 LAB — ABO/RH: ABO/RH(D): O POS

## 2019-01-04 LAB — CBC
HCT: 31.6 % — ABNORMAL LOW (ref 36.0–46.0)
Hemoglobin: 10 g/dL — ABNORMAL LOW (ref 12.0–15.0)
MCH: 24.8 pg — ABNORMAL LOW (ref 26.0–34.0)
MCHC: 31.6 g/dL (ref 30.0–36.0)
MCV: 78.4 fL — ABNORMAL LOW (ref 80.0–100.0)
Platelets: 296 10*3/uL (ref 150–400)
RBC: 4.03 MIL/uL (ref 3.87–5.11)
RDW: 15.9 % — ABNORMAL HIGH (ref 11.5–15.5)
WBC: 8.2 10*3/uL (ref 4.0–10.5)
nRBC: 0 % (ref 0.0–0.2)

## 2019-01-04 LAB — TYPE AND SCREEN
ABO/RH(D): O POS
Antibody Screen: NEGATIVE

## 2019-01-04 MED ORDER — TETANUS-DIPHTH-ACELL PERTUSSIS 5-2.5-18.5 LF-MCG/0.5 IM SUSP: 0.5000 mL | Freq: Once | INTRAMUSCULAR | Status: DC

## 2019-01-04 MED ORDER — DIBUCAINE (PERIANAL) 1 % EX OINT: 1.0000 "application " | TOPICAL_OINTMENT | CUTANEOUS | Status: DC | PRN

## 2019-01-04 MED ORDER — ONDANSETRON HCL 4 MG PO TABS: 4.0000 mg | ORAL_TABLET | ORAL | Status: DC | PRN

## 2019-01-04 MED ORDER — FENTANYL-BUPIVACAINE-NACL 0.5-0.125-0.9 MG/250ML-% EP SOLN
12.0000 mL/h | EPIDURAL | Status: DC | PRN
  Filled 2019-01-04: qty 250

## 2019-01-04 MED ORDER — PRENATAL MULTIVITAMIN CH
1.0000 | ORAL_TABLET | Freq: Every day | ORAL | Status: DC
  Administered 2019-01-05: 12:00:00 1 via ORAL
  Filled 2019-01-04: qty 1

## 2019-01-04 MED ORDER — ACETAMINOPHEN 325 MG PO TABS: 650.0000 mg | ORAL_TABLET | ORAL | Status: DC | PRN

## 2019-01-04 MED ORDER — PENICILLIN G 3 MILLION UNITS IVPB - SIMPLE MED
3.0000 10*6.[IU] | INTRAVENOUS | Status: DC
  Administered 2019-01-04: 3 10*6.[IU] via INTRAVENOUS
  Filled 2019-01-04: qty 100

## 2019-01-04 MED ORDER — OXYTOCIN BOLUS FROM INFUSION
500.0000 mL | Freq: Once | INTRAVENOUS | Status: AC
  Administered 2019-01-04: 14:00:00 500 mL via INTRAVENOUS

## 2019-01-04 MED ORDER — OXYCODONE-ACETAMINOPHEN 5-325 MG PO TABS: 2.0000 | ORAL_TABLET | ORAL | Status: DC | PRN

## 2019-01-04 MED ORDER — LACTATED RINGERS IV SOLN
500.0000 mL | Freq: Once | INTRAVENOUS | Status: AC
  Administered 2019-01-04: 12:00:00 500 mL via INTRAVENOUS

## 2019-01-04 MED ORDER — METHYLERGONOVINE MALEATE 0.2 MG/ML IJ SOLN: 0.2000 mg | INTRAMUSCULAR | Status: DC | PRN

## 2019-01-04 MED ORDER — LIDOCAINE-EPINEPHRINE (PF) 2 %-1:200000 IJ SOLN
INTRAMUSCULAR | Status: DC | PRN
  Administered 2019-01-04: 2 mL via EPIDURAL
  Administered 2019-01-04: 3 mL via EPIDURAL

## 2019-01-04 MED ORDER — TERBUTALINE SULFATE 1 MG/ML IJ SOLN: 0.2500 mg | Freq: Once | INTRAMUSCULAR | Status: DC | PRN

## 2019-01-04 MED ORDER — SIMETHICONE 80 MG PO CHEW: 80.0000 mg | CHEWABLE_TABLET | ORAL | Status: DC | PRN

## 2019-01-04 MED ORDER — COCONUT OIL OIL: 1.0000 "application " | TOPICAL_OIL | Status: DC | PRN

## 2019-01-04 MED ORDER — OXYTOCIN 40 UNITS IN NORMAL SALINE INFUSION - SIMPLE MED
1.0000 m[IU]/min | INTRAVENOUS | Status: DC
  Administered 2019-01-04: 09:00:00 2 m[IU]/min via INTRAVENOUS
  Filled 2019-01-04: qty 1000

## 2019-01-04 MED ORDER — PHENYLEPHRINE 40 MCG/ML (10ML) SYRINGE FOR IV PUSH (FOR BLOOD PRESSURE SUPPORT)
80.0000 ug | PREFILLED_SYRINGE | INTRAVENOUS | Status: DC | PRN
  Administered 2019-01-04: 80 ug via INTRAVENOUS

## 2019-01-04 MED ORDER — WITCH HAZEL-GLYCERIN EX PADS: 1.0000 "application " | MEDICATED_PAD | CUTANEOUS | Status: DC | PRN

## 2019-01-04 MED ORDER — EPHEDRINE 5 MG/ML INJ: 10.0000 mg | INTRAVENOUS | Status: DC | PRN

## 2019-01-04 MED ORDER — IBUPROFEN 600 MG PO TABS
600.0000 mg | ORAL_TABLET | Freq: Four times a day (QID) | ORAL | Status: DC
  Administered 2019-01-04 – 2019-01-05 (×4): 600 mg via ORAL
  Filled 2019-01-04 (×4): qty 1

## 2019-01-04 MED ORDER — BENZOCAINE-MENTHOL 20-0.5 % EX AERO
1.0000 "application " | INHALATION_SPRAY | CUTANEOUS | Status: DC | PRN
  Administered 2019-01-04: 1 via TOPICAL
  Filled 2019-01-04: qty 56

## 2019-01-04 MED ORDER — LIDOCAINE HCL (PF) 1 % IJ SOLN: 30.0000 mL | INTRAMUSCULAR | Status: DC | PRN

## 2019-01-04 MED ORDER — DIPHENHYDRAMINE HCL 50 MG/ML IJ SOLN: 12.5000 mg | INTRAMUSCULAR | Status: DC | PRN

## 2019-01-04 MED ORDER — SOD CITRATE-CITRIC ACID 500-334 MG/5ML PO SOLN: 30.0000 mL | ORAL | Status: DC | PRN

## 2019-01-04 MED ORDER — LACTATED RINGERS IV SOLN
INTRAVENOUS | Status: DC
  Administered 2019-01-04: 09:00:00 via INTRAVENOUS

## 2019-01-04 MED ORDER — LACTATED RINGERS IV SOLN
500.0000 mL | INTRAVENOUS | Status: DC | PRN
  Administered 2019-01-04: 13:00:00 1000 mL via INTRAVENOUS

## 2019-01-04 MED ORDER — OXYCODONE-ACETAMINOPHEN 5-325 MG PO TABS: 1.0000 | ORAL_TABLET | ORAL | Status: DC | PRN

## 2019-01-04 MED ORDER — OXYTOCIN 40 UNITS IN NORMAL SALINE INFUSION - SIMPLE MED
2.5000 [IU]/h | INTRAVENOUS | Status: DC
  Administered 2019-01-04: 14:00:00 2.5 [IU]/h via INTRAVENOUS

## 2019-01-04 MED ORDER — ONDANSETRON HCL 4 MG/2ML IJ SOLN: 4.0000 mg | INTRAMUSCULAR | Status: DC | PRN

## 2019-01-04 MED ORDER — SODIUM CHLORIDE 0.9 % IV SOLN
5.0000 10*6.[IU] | Freq: Once | INTRAVENOUS | Status: AC
  Administered 2019-01-04: 09:00:00 5 10*6.[IU] via INTRAVENOUS
  Filled 2019-01-04: qty 5

## 2019-01-04 MED ORDER — ONDANSETRON HCL 4 MG/2ML IJ SOLN: 4.0000 mg | Freq: Four times a day (QID) | INTRAMUSCULAR | Status: DC | PRN

## 2019-01-04 MED ORDER — DIPHENHYDRAMINE HCL 25 MG PO CAPS: 25.0000 mg | ORAL_CAPSULE | Freq: Four times a day (QID) | ORAL | Status: DC | PRN

## 2019-01-04 MED ORDER — METHYLERGONOVINE MALEATE 0.2 MG PO TABS: 0.2000 mg | ORAL_TABLET | ORAL | Status: DC | PRN

## 2019-01-04 MED ORDER — ZOLPIDEM TARTRATE 5 MG PO TABS: 5.0000 mg | ORAL_TABLET | Freq: Every evening | ORAL | Status: DC | PRN

## 2019-01-04 MED ORDER — SODIUM CHLORIDE (PF) 0.9 % IJ SOLN
INTRAMUSCULAR | Status: DC | PRN
  Administered 2019-01-04: 14 mL/h via EPIDURAL

## 2019-01-04 MED ORDER — SENNOSIDES-DOCUSATE SODIUM 8.6-50 MG PO TABS
2.0000 | ORAL_TABLET | ORAL | Status: DC
  Administered 2019-01-04: 2 via ORAL
  Filled 2019-01-04: qty 2

## 2019-01-04 MED ORDER — PHENYLEPHRINE 40 MCG/ML (10ML) SYRINGE FOR IV PUSH (FOR BLOOD PRESSURE SUPPORT)
80.0000 ug | PREFILLED_SYRINGE | INTRAVENOUS | Status: DC | PRN
  Filled 2019-01-04: qty 10

## 2019-01-04 NOTE — Progress Notes (Signed)
Attempted to call Dr Armond Hang.  MD busy with other patients instructed to call back in 10-15 minutes

## 2019-01-04 NOTE — Anesthesia Postprocedure Evaluation (Signed)
Anesthesia Post Note  Patient: [name] [facility]  Procedure(s) Performed: AN AD HOC LABOR EPIDURAL     Patient location during evaluation: Mother Baby Anesthesia Type: Epidural Level of consciousness: awake and alert Pain management: pain level controlled Vital Signs Assessment: post-procedure vital signs reviewed and stable Respiratory status: spontaneous breathing, nonlabored ventilation and respiratory function stable Cardiovascular status: stable Postop Assessment: no headache, no backache and epidural receding Anesthetic complications: no    Last Vitals:  Vitals:   01/04/19 1600 01/04/19 1700  BP: 123/63 124/77  Pulse: (!) 55 72  Resp: 17 18  Temp: 36.5 C 36.9 C    Last Pain:  Vitals:   01/04/19 1700  TempSrc: Oral  PainSc: 2    Pain Goal:                   Marquette Saa

## 2019-01-04 NOTE — Anesthesia Preprocedure Evaluation (Signed)
Anesthesia Evaluation  Patient identified by MRN, date of birth, ID band Patient awake    Reviewed: Allergy & Precautions, NPO status , Patient's Chart, lab work & pertinent test results  Airway Mallampati: II  TM Distance: >3 FB Neck ROM: Full    Dental no notable dental hx.    Pulmonary neg pulmonary ROS,    Pulmonary exam normal breath sounds clear to auscultation       Cardiovascular negative cardio ROS Normal cardiovascular exam Rhythm:Regular Rate:Normal     Neuro/Psych PSYCHIATRIC DISORDERS Anxiety negative neurological ROS     GI/Hepatic negative GI ROS, Neg liver ROS,   Endo/Other  negative endocrine ROS  Renal/GU negative Renal ROS  negative genitourinary   Musculoskeletal negative musculoskeletal ROS (+)   Abdominal   Peds negative pediatric ROS (+)  Hematology negative hematology ROS (+)   Anesthesia Other Findings   Reproductive/Obstetrics (+) Pregnancy                             Anesthesia Physical Anesthesia Plan  ASA: II  Anesthesia Plan: Epidural   Post-op Pain Management:    Induction:   PONV Risk Score and Plan: Treatment may vary due to age or medical condition  Airway Management Planned: Natural Airway  Additional Equipment:   Intra-op Plan:   Post-operative Plan:   Informed Consent: I have reviewed the patients History and Physical, chart, labs and discussed the procedure including the risks, benefits and alternatives for the proposed anesthesia with the patient or authorized representative who has indicated his/her understanding and acceptance.       Plan Discussed with: Anesthesiologist  Anesthesia Plan Comments: (Patient identified. Risks, benefits, options discussed with patient including but not limited to bleeding, infection, nerve damage, paralysis, failed block, incomplete pain control, headache, blood pressure changes, nausea, vomiting,  reactions to medication, itching, and post partum back pain. Confirmed with bedside nurse the patient's most recent platelet count. Confirmed with the patient that they are not taking any anticoagulation, have any bleeding history or any family history of bleeding disorders. Patient expressed understanding and wishes to proceed. All questions were answered. )        Anesthesia Quick Evaluation

## 2019-01-04 NOTE — Anesthesia Procedure Notes (Signed)
Epidural Patient location during procedure: OB Start time: 01/04/2019 11:45 AM End time: 01/04/2019 12:00 PM  Staffing Anesthesiologist: Elmer Picker, MD Performed: anesthesiologist   Preanesthetic Checklist Completed: patient identified, pre-op evaluation, timeout performed, IV checked, risks and benefits discussed and monitors and equipment checked  Epidural Patient position: sitting Prep: site prepped and draped and DuraPrep Patient monitoring: continuous pulse ox, blood pressure, heart rate and cardiac monitor Approach: midline Location: L3-L4 Injection technique: LOR air  Needle:  Needle type: Tuohy  Needle gauge: 17 G Needle length: 9 cm Needle insertion depth: 6 cm Catheter type: closed end flexible Catheter size: 19 Gauge Catheter at skin depth: 12 cm Test dose: negative  Assessment Sensory level: T8 Events: blood not aspirated, injection not painful, no injection resistance, negative IV test and no paresthesia  Additional Notes Patient identified. Risks/Benefits/Options discussed with patient including but not limited to bleeding, infection, nerve damage, paralysis, failed block, incomplete pain control, headache, blood pressure changes, nausea, vomiting, reactions to medication both or allergic, itching and postpartum back pain. Confirmed with bedside nurse the patient's most recent platelet count. Confirmed with patient that they are not currently taking any anticoagulation, have any bleeding history or any family history of bleeding disorders. Patient expressed understanding and wished to proceed. All questions were answered. Sterile technique was used throughout the entire procedure. Please see nursing notes for vital signs. Test dose was given through epidural catheter and negative prior to continuing to dose epidural or start infusion. Warning signs of high block given to the patient including shortness of breath, tingling/numbness in hands, complete motor block,  or any concerning symptoms with instructions to call for help. Patient was given instructions on fall risk and not to get out of bed. All questions and concerns addressed with instructions to call with any issues or inadequate analgesia.  Reason for block:procedure for pain

## 2019-01-04 NOTE — H&P (Signed)
[name] is a 33 y.o. female presenting for IOL for advanced dilatation and GBS pos. OB History    Gravida  2   Para  1   Term  1   Preterm      AB      Living  1     SAB      TAB      Ectopic      Multiple  0   Live Births  1          Past Medical History:  Diagnosis Date  . Allergy   . Anxiety    Past Surgical History:  Procedure Laterality Date  . NO PAST SURGERIES     Family History: family history includes Glaucoma in her father; Hepatitis C in her father. Social History:  reports that she has never smoked. She does not have any smokeless tobacco history on file. She reports current alcohol use. She reports that she does not use drugs.     Maternal Diabetes: No Genetic Screening: Normal Maternal Ultrasounds/Referrals: Normal Fetal Ultrasounds or other Referrals:  None Maternal Substance Abuse:  No Significant Maternal Medications:  None Significant Maternal Lab Results:  Lab values include: Group B Strep positive Other Comments:  None  Review of Systems  Constitutional: Negative.   All other systems reviewed and are negative.  Maternal Medical History:  Contractions: Onset was less than 1 hour ago.   Frequency: rare.   Perceived severity is mild.    Fetal activity: Perceived fetal activity is normal.   Last perceived fetal movement was within the past hour.    Prenatal complications: no prenatal complications Prenatal Complications - Diabetes: none.      Temperature 98.3 F (36.8 C), temperature source Oral, resp. rate 16, height 5\' 2"  (1.575 m), weight 74.4 kg, last menstrual period 04/06/2018, unknown if currently breastfeeding. Maternal Exam:  Uterine Assessment: Contraction strength is mild.  Contraction frequency is irregular.   Abdomen: Patient reports no abdominal tenderness. Fetal presentation: vertex  Introitus: Normal vulva. Normal vagina.  Ferning test: not done.  Nitrazine test: not done. Amniotic fluid character:  not assessed.  Pelvis: adequate for delivery.   Cervix: Cervix evaluated by digital exam.     Physical Exam  Nursing note and vitals reviewed. Constitutional: She is oriented to person, place, and time. She appears well-developed and well-nourished.  HENT:  Head: Normocephalic and atraumatic.  Neck: Normal range of motion. Neck supple.  Cardiovascular: Normal rate and regular rhythm.  Respiratory: Effort normal and breath sounds normal.  GI: Soft. Bowel sounds are normal.  Genitourinary:    Vulva, vagina and uterus normal.   Musculoskeletal: Normal range of motion.  Neurological: She is alert and oriented to person, place, and time. She has normal reflexes.  Skin: Skin is warm and dry.  Psychiatric: She has a normal mood and affect.    Prenatal labs: ABO, Rh:   Antibody:   Rubella: Immune (10/08 0000) RPR: Nonreactive (10/08 0000)  HBsAg: Negative (10/08 0000)  HIV: Non-reactive (10/08 0000)  GBS:     Assessment/Plan: 39wk IUP Advanced dilatation GBS pos IOL   [name] 01/04/2019, 8:35 AM

## 2019-01-04 NOTE — Progress Notes (Signed)
[name] is a 33 y.o. G2P1001 at [redacted]w[redacted]d by LMP admitted for induction of labor due to Prior precipitous labor and gbs pos.  Subjective: Comfortable Minimal pressure  Objective: BP (!) 93/57   Pulse 82   Temp 97.8 F (36.6 C) (Oral)   Resp 16   Ht 5\' 2"  (1.575 m)   Wt 74.4 kg   LMP 04/06/2018   BMI 30.00 kg/m  No intake/output data recorded. No intake/output data recorded.  FHT:  FHR: 110 bpm, variability: moderate,  accelerations:  Present,  decelerations:  Present variables to 70s with contractions UC:   irregular, every 3 minutes SVE:   9/100/0 FSE placed due to inability to trace FHR  Labs: Lab Results  Component Value Date   WBC 8.2 01/04/2019   HGB 10.0 (L) 01/04/2019   HCT 31.6 (L) 01/04/2019   MCV 78.4 (L) 01/04/2019   PLT 296 01/04/2019    Assessment / Plan: Induction of labor due to prior precipitous labor and gbs pos,  progressing well on pitocin  Labor: Progressing normally Preeclampsia:  no signs or symptoms of toxicity Fetal Wellbeing:  Category I and category 2 Pain Control:  Epidural I/D:  n/a Anticipated MOD:  NSVD  Cleda Imel J 01/04/2019, 12:59 PM

## 2019-01-04 NOTE — Progress Notes (Signed)
[name] is a 33 y.o. G2P1001 at [redacted]w[redacted]d by LMP admitted for induction of labor due to Prior precipitous labor and GBS.  Subjective: Awaiting epidural  Objective: BP 113/67   Pulse 67   Temp 98.3 F (36.8 C) (Oral)   Resp 20   Ht 5\' 2"  (1.575 m)   Wt 74.4 kg   LMP 04/06/2018   BMI 30.00 kg/m  No intake/output data recorded. No intake/output data recorded.  FHT:  FHR: 145 bpm, variability: moderate,  accelerations:  Present,  decelerations:  Absent and   UC:   regular, every 3 minutes SVE:   4-5/70/-1 AROM clear  Labs: Lab Results  Component Value Date   WBC 8.2 01/04/2019   HGB 10.0 (L) 01/04/2019   HCT 31.6 (L) 01/04/2019   MCV 78.4 (L) 01/04/2019   PLT 296 01/04/2019    Assessment / Plan: Induction of labor due to prior precipitous labor and GBS,  progressing well on pitocin  Labor: Progressing normally Preeclampsia:  no signs or symptoms of toxicity Fetal Wellbeing:  Category I Pain Control:  Labor support without medications I/D:  n/a Anticipated MOD:  NSVD  Rickia Freeburg J 01/04/2019, 12:07 PM

## 2019-01-05 ENCOUNTER — Encounter (HOSPITAL_COMMUNITY): Payer: Self-pay | Admitting: *Deleted

## 2019-01-05 LAB — CBC
HCT: 33.2 % — ABNORMAL LOW (ref 36.0–46.0)
Hemoglobin: 10.4 g/dL — ABNORMAL LOW (ref 12.0–15.0)
MCH: 24.8 pg — ABNORMAL LOW (ref 26.0–34.0)
MCHC: 31.3 g/dL (ref 30.0–36.0)
MCV: 79.2 fL — ABNORMAL LOW (ref 80.0–100.0)
Platelets: 300 10*3/uL (ref 150–400)
RBC: 4.19 MIL/uL (ref 3.87–5.11)
RDW: 16.2 % — ABNORMAL HIGH (ref 11.5–15.5)
WBC: 12.3 10*3/uL — ABNORMAL HIGH (ref 4.0–10.5)
nRBC: 0 % (ref 0.0–0.2)

## 2019-01-05 LAB — RPR: RPR Ser Ql: NONREACTIVE

## 2019-01-05 MED ORDER — IBUPROFEN 600 MG PO TABS: 600.0000 mg | ORAL_TABLET | Freq: Four times a day (QID) | ORAL | 0 refills | Status: DC

## 2019-01-05 NOTE — Progress Notes (Signed)
MOB was referred for history of anxiety.  * Referral screened out by Clinical Social Worker because none of the following criteria appear to apply:  ~ History of anxiety/depression during this pregnancy, or of post-partum depression following prior delivery. ~ Diagnosis of anxiety and/or depression within last 3 years. Per chart review, anxiety noted prior to 2014. OR * MOB's symptoms currently being treated with medication and/or therapy.  Please contact the Clinical Social Worker if needs arise, by [facility] request, or if MOB scores greater than 9/yes to question 10 on Edinburgh Postpartum Depression Screen.  Archie Balboa, LCSWA  [facility] [phone]

## 2019-01-05 NOTE — Lactation Note (Signed)
This note was copied from a baby's chart. Lactation Consultation Note Baby 12 hrs old. Mom states baby is BF well. Mom Bf her now 33 yr old for 2 months. Mom had to supplement d/t low milk supply. Mom stated she returned to work, felt that may had caused an increased low milk supply. Mom demonstrated hand expression w/easy flow of colostrum.  Mom BF baby in cradle position. Appears shallow. Baby doesn't have wide flange, just has nipple in mouth.  LC un-swaddled baby, encouraged cheeks to breast, chin tug, STS,and breast massage. Assisted in latching and positioning. Mom stated she felt the difference in latch. Heard swallows.  Mom has "V" shaped breast. Baby everted nipple well to the Lt. Breast it was feeding on. Rt. Breast nipple appears flat, breast tissue thick feeling, only compressible to top and bottom, not compressible on each side of nipple.  Shells given to wear. Mom stated she had to wear a NS to BF her first child.  Mom shown how to use DEBP & how to disassemble, clean, & reassemble parts. Encouraged to pump 5-6 times a day for stimulation.  Newborn born behavior, STS, I&O, cluster feeding, body alignment, supply and demand discussed. Encouraged mom to call for assistance or questions. Lactation brochure left at bedside.    Patient Name: [name] [id number] Date: 01/05/2019 Reason for consult: Initial assessment   Maternal Data Has patient been taught Hand Expression?: Yes Does the patient have breastfeeding experience prior to this delivery?: Yes  Feeding Feeding Type: Breast Fed  LATCH Score Latch: Grasps breast easily, tongue down, lips flanged, rhythmical sucking.  Audible Swallowing: Spontaneous and intermittent  Type of Nipple: Everted at rest and after stimulation  Comfort (Breast/Nipple): Soft / non-tender  Hold (Positioning): Assistance needed to correctly position infant at breast and maintain latch.  LATCH Score:  9  Interventions Interventions: Breast feeding basics reviewed;Breast compression;Assisted with latch;Adjust position;Support pillows;DEBP;Breast massage;Position options;Hand express;Pre-pump if needed;Shells;Hand pump  Lactation Tools Discussed/Used Tools: Shells;Pump Shell Type: Inverted Breast pump type: Double-Electric Breast Pump;Manual WIC Program: No Pump Review: Setup, frequency, and cleaning;Milk Storage Initiated by:: Peri Jefferson RN IBCLC Date initiated:: 01/05/19   Consult Status Consult Status: Follow-up Date: 01/06/19 Follow-up type: In-patient    Margarita Bobrowski, Diamond Nickel 01/05/2019, 2:19 AM

## 2019-01-05 NOTE — Progress Notes (Signed)
PPD 1 VAVD with 2nd degree repair  S:  Reports feeling tried - very little sleep / mild cramps             Tolerating po/ No nausea or vomiting             Bleeding is light             Up ad lib / ambulatory / voiding QS  Newborn Breast / Circumcision planned  O: VS: BP 117/84 (BP Location: Right Arm)   Pulse (!) 59   Temp 97.9 F (36.6 C) (Oral)   Resp 18   Ht 5\' 2"  (1.575 m)   Wt 74.4 kg   LMP 04/06/2018   SpO2 99%   Breastfeeding Unknown   BMI 30.00 kg/m    LABS:             Recent Labs    01/04/19 0835 01/05/19 0505  WBC 8.2 12.3*  HGB 10.0* 10.4*  PLT 296 300               Blood type: --/--/O POS, O POS Performed at [facility], [address] [place], [place] [address]  [phone])  Rubella: Immune (10/08 0000)                     I&O: Intake/Output      05/03 0701 - 05/04 0700 05/04 0701 - 05/05 0700   Urine (mL/kg/hr) 200    Blood 200    Total Output 400    Net -400                   Physical Exam:             Alert and oriented X3  Abdomen: soft, non-tender, non-distended              Fundus: firm, non-tender, Ueven  Perineum: ice pack in place  Lochia: light  Extremities: no edema, no calf pain or tenderness    A: PPD # 1 VAVD with 2nd degree repair   doing well - stable status  P: Routine post partum orders  DC tomorrow  Marlinda Mike CNM, MSN, [facility] 01/05/2019, 8:44 AM

## 2019-01-05 NOTE — Lactation Note (Signed)
This note was copied from a baby's chart. Lactation Consultation Note  Patient Name: [name] [id number] Date: 01/05/2019 Reason for consult: Follow-up assessment Baby is 4 hours old.  He is currently in the nursery for a circumcision.  Mom reports that baby is latching easily.  She has no questions or concerns.  Instructed to feed with cues and call for assist prn.  Maternal Data    Feeding Feeding Type: Breast Fed  LATCH Score Latch: Repeated attempts needed to sustain latch, nipple held in mouth throughout feeding, stimulation needed to elicit sucking reflex.  Audible Swallowing: Spontaneous and intermittent  Type of Nipple: Everted at rest and after stimulation  Comfort (Breast/Nipple): Soft / non-tender  Hold (Positioning): Assistance needed to correctly position infant at breast and maintain latch.  LATCH Score: 8  Interventions    Lactation Tools Discussed/Used     Consult Status Consult Status: Follow-up Date: 01/06/19 Follow-up type: In-patient    Huston Foley 01/05/2019, 10:46 AM

## 2019-01-05 NOTE — Discharge Summary (Signed)
Obstetric Discharge Summary Reason for Admission: induction of labor Prenatal Procedures: none Intrapartum Procedures: spontaneous vaginal delivery Postpartum Procedures: none Complications-Operative and Postpartum: 2nd degree perineal laceration Hemoglobin  Date Value Ref Range Status  01/05/2019 10.4 (L) 12.0 - 15.0 g/dL Final   HCT  Date Value Ref Range Status  01/05/2019 33.2 (L) 36.0 - 46.0 % Final    Physical Exam:  General: alert, cooperative and no distress Lochia: appropriate Uterine Fundus: firm Incision: healing well DVT Evaluation: No evidence of DVT seen on physical exam.  Discharge Diagnoses: Term Pregnancy-delivered  Discharge Information: Date: 01/05/2019 Activity: pelvic rest Diet: routine Medications: PNV and Ibuprofen Condition: stable Instructions: refer to practice specific booklet Discharge to: home Follow-up Information    Olivia Mackie, MD. Schedule an appointment as soon as possible for a visit in 6 week(s).   Specialty:  Obstetrics and Gynecology Contact information: [address] [place] [address] [phone]           Newborn Data: Live born female  Birth Weight: 6 lb 7.5 oz (2935 g) APGAR: 8, 9  Newborn Delivery   Birth date/time:  01/04/2019 13:28:00 Delivery type:  Vaginal, Vacuum (Extractor)     Home with mother.  Marlinda Mike 01/05/2019, 4:14 PM

## 2019-01-10 ENCOUNTER — Encounter (HOSPITAL_COMMUNITY): Payer: Self-pay | Admitting: *Deleted

## 2020-08-13 ENCOUNTER — Other Ambulatory Visit: Payer: BLUE CROSS/BLUE SHIELD

## 2020-08-28 ENCOUNTER — Telehealth: Payer: Self-pay | Admitting: Unknown Physician Specialty

## 2020-08-28 ENCOUNTER — Other Ambulatory Visit: Payer: Self-pay | Admitting: Unknown Physician Specialty

## 2020-08-28 DIAGNOSIS — E663 Overweight: Secondary | ICD-10-CM

## 2020-08-28 DIAGNOSIS — U071 COVID-19: Secondary | ICD-10-CM

## 2020-08-28 NOTE — Telephone Encounter (Signed)
I connected by phone with [name] on 08/28/2020 at 3:05 PM to discuss the potential use of a new treatment for mild to moderate COVID-19 viral infection in non-hospitalized patients.  This patient is a 34 y.o. female that meets the FDA criteria for Emergency Use Authorization of COVID monoclonal antibody casirivimab/imdevimab, bamlanivimab/etesevimab, or sotrovimab.  Has a (+) direct SARS-CoV-2 viral test result  Has mild or moderate COVID-19   Is NOT hospitalized due to COVID-19  Is within 10 days of symptom onset  Has at least one of the high risk factor(s) for progression to severe COVID-19 and/or hospitalization as defined in EUA.  Specific high risk criteria : BMI > 25   I have spoken and communicated the following to the patient or parent/caregiver regarding COVID monoclonal antibody treatment:  1. FDA has authorized the emergency use for the treatment of mild to moderate COVID-19 in adults and pediatric patients with positive results of direct SARS-CoV-2 viral testing who are 57 years of age and older weighing at least 40 kg, and who are at high risk for progressing to severe COVID-19 and/or hospitalization.  2. The significant known and potential risks and benefits of COVID monoclonal antibody, and the extent to which such potential risks and benefits are unknown.  3. Information on available alternative treatments and the risks and benefits of those alternatives, including clinical trials.  4. Patients treated with COVID monoclonal antibody should continue to self-isolate and use infection control measures (e.g., wear mask, isolate, social distance, avoid sharing personal items, clean and disinfect "high touch" surfaces, and frequent handwashing) according to CDC guidelines.   5. The patient or parent/caregiver has the option to accept or refuse COVID monoclonal antibody treatment.  After reviewing this information with the patient, the patient has agreed to receive one  of the available covid 19 monoclonal antibodies and will be provided an appropriate fact sheet prior to infusion. Gabriel Cirri, NP 08/28/2020 3:05 PM  Sx onset 12/21

## 2020-08-29 ENCOUNTER — Ambulatory Visit (HOSPITAL_COMMUNITY)
Admission: RE | Admit: 2020-08-29 | Discharge: 2020-08-29 | Disposition: A | Discharge status: Home / Self Care | Payer: 59 | Source: Ambulatory Visit | Attending: Pulmonary Disease | Admitting: Pulmonary Disease

## 2020-08-29 DIAGNOSIS — E663 Overweight: Secondary | ICD-10-CM | POA: Diagnosis present

## 2020-08-29 DIAGNOSIS — U071 COVID-19: Secondary | ICD-10-CM | POA: Diagnosis present

## 2020-08-29 MED ORDER — FAMOTIDINE IN NACL 20-0.9 MG/50ML-% IV SOLN: 20.0000 mg | Freq: Once | INTRAVENOUS | Status: DC | PRN

## 2020-08-29 MED ORDER — SODIUM CHLORIDE 0.9 % IV SOLN: Freq: Once | INTRAVENOUS | Status: AC

## 2020-08-29 MED ORDER — METHYLPREDNISOLONE SODIUM SUCC 125 MG IJ SOLR: 125.0000 mg | Freq: Once | INTRAMUSCULAR | Status: DC | PRN

## 2020-08-29 MED ORDER — ALBUTEROL SULFATE HFA 108 (90 BASE) MCG/ACT IN AERS: 2.0000 | INHALATION_SPRAY | Freq: Once | RESPIRATORY_TRACT | Status: DC | PRN

## 2020-08-29 MED ORDER — SODIUM CHLORIDE 0.9 % IV SOLN: INTRAVENOUS | Status: DC | PRN

## 2020-08-29 MED ORDER — DIPHENHYDRAMINE HCL 50 MG/ML IJ SOLN: 50.0000 mg | Freq: Once | INTRAMUSCULAR | Status: DC | PRN

## 2020-08-29 MED ORDER — EPINEPHRINE 0.3 MG/0.3ML IJ SOAJ: 0.3000 mg | Freq: Once | INTRAMUSCULAR | Status: DC | PRN

## 2020-08-29 NOTE — Progress Notes (Signed)
Patient reviewed Fact Sheet for Patients, Parents, and Caregivers for Emergency Use Authorization (EUA) of REGEN-COV for the Treatment of Coronavirus. Patient also reviewed and is agreeable to the estimated cost of treatment. Patient is agreeable to proceed.    

## 2020-08-29 NOTE — Discharge Instructions (Signed)
10 Things You Can Do to Manage Your COVID-19 Symptoms at Home If you have possible or confirmed COVID-19: 1. Stay home from work and school. And stay away from other public places. If you must go out, avoid using any kind of public transportation, ridesharing, or taxis. 2. Monitor your symptoms carefully. If your symptoms get worse, call your healthcare provider immediately. 3. Get rest and stay hydrated. 4. If you have a medical appointment, call the healthcare provider ahead of time and tell them that you have or may have COVID-19. 5. For medical emergencies, call 911 and notify the dispatch personnel that you have or may have COVID-19. 6. Cover your cough and sneezes with a tissue or use the inside of your elbow. 7. Wash your hands often with soap and water for at least 20 seconds or clean your hands with an alcohol-based hand sanitizer that contains at least 60% alcohol. 8. As much as possible, stay in a specific room and away from other people in your home. Also, you should use a separate bathroom, if available. If you need to be around other people in or outside of the home, wear a mask. 9. Avoid sharing personal items with other people in your household, like dishes, towels, and bedding. 10. Clean all surfaces that are touched often, like counters, tabletops, and doorknobs. Use household cleaning sprays or wipes according to the label instructions. cdc.gov/coronavirus 03/04/2019 This information is not intended to replace advice given to you by your health care provider. Make sure you discuss any questions you have with your health care provider. Document Revised: 08/06/2019 Document Reviewed: 08/06/2019 Elsevier Patient Education  2020 Elsevier Inc. What types of side effects do monoclonal antibody drugs cause?  Common side effects  In general, the more common side effects caused by monoclonal antibody drugs include: . Allergic reactions, such as hives or itching . Flu-like signs and  symptoms, including chills, fatigue, fever, and muscle aches and pains . Nausea, vomiting . Diarrhea . Skin rashes . Low blood pressure   The CDC is recommending patients who receive monoclonal antibody treatments wait at least 90 days before being vaccinated.  Currently, there are no data on the safety and efficacy of mRNA COVID-19 vaccines in persons who received monoclonal antibodies or convalescent plasma as part of COVID-19 treatment. Based on the estimated half-life of such therapies as well as evidence suggesting that reinfection is uncommon in the 90 days after initial infection, vaccination should be deferred for at least 90 days, as a precautionary measure until additional information becomes available, to avoid interference of the antibody treatment with vaccine-induced immune responses. If you have any questions or concerns after the infusion please call the Advanced Practice Provider on call at [phone]. This number is ONLY intended for your use regarding questions or concerns about the infusion post-treatment side-effects.  Please do not provide this number to others for use. For return to work notes please contact your primary care provider.   If someone you know is interested in receiving treatment please have them call the COVID hotline at [phone].   

## 2020-08-29 NOTE — Progress Notes (Addendum)
  Diagnosis: COVID-19  Physician:  Dr. Patrick Wright  Procedure: [facility] Med: casirivimab\imdevimab infusion - Provided patient with casirivimab\imdevimab fact sheet for patients, parents and caregivers prior to infusion.  Complications: No immediate complications noted.  Discharge: Discharged home   [name] 08/29/2020   

## 2021-05-17 ENCOUNTER — Encounter: Payer: Self-pay | Admitting: Internal Medicine

## 2021-05-26 ENCOUNTER — Encounter (HOSPITAL_COMMUNITY): Payer: Self-pay | Admitting: Emergency Medicine

## 2021-05-26 ENCOUNTER — Other Ambulatory Visit: Payer: Self-pay

## 2021-05-26 ENCOUNTER — Emergency Department (HOSPITAL_COMMUNITY)
Admission: EM | Admit: 2021-05-26 | Discharge: 2021-05-26 | Disposition: A | Discharge status: Home / Self Care | Payer: 59 | Attending: Emergency Medicine | Admitting: Emergency Medicine

## 2021-05-26 ENCOUNTER — Emergency Department (HOSPITAL_COMMUNITY): Payer: 59

## 2021-05-26 DIAGNOSIS — R072 Precordial pain: Secondary | ICD-10-CM | POA: Insufficient documentation

## 2021-05-26 DIAGNOSIS — Z5321 Procedure and treatment not carried out due to patient leaving prior to being seen by health care provider: Secondary | ICD-10-CM | POA: Insufficient documentation

## 2021-05-26 LAB — COMPREHENSIVE METABOLIC PANEL
ALT: 18 U/L (ref 0–44)
AST: 18 U/L (ref 15–41)
Albumin: 3.4 g/dL — ABNORMAL LOW (ref 3.5–5.0)
Alkaline Phosphatase: 42 U/L (ref 38–126)
Anion gap: 7 (ref 5–15)
BUN: 14 mg/dL (ref 6–20)
CO2: 22 mmol/L (ref 22–32)
Calcium: 9.1 mg/dL (ref 8.9–10.3)
Chloride: 107 mmol/L (ref 98–111)
Creatinine, Ser: 0.82 mg/dL (ref 0.44–1.00)
GFR, Estimated: >60 mL/min (ref >60)
Glucose, Bld: 92 mg/dL (ref 70–99)
Potassium: 4.1 mmol/L (ref 3.5–5.1)
Sodium: 136 mmol/L (ref 135–145)
Total Bilirubin: 0.5 mg/dL (ref 0.3–1.2)
Total Protein: 6.8 g/dL (ref 6.5–8.1)

## 2021-05-26 LAB — CBC WITH DIFFERENTIAL/PLATELET
Abs Immature Granulocytes: 0.02 10*3/uL (ref 0.00–0.07)
Basophils Absolute: 0.1 10*3/uL (ref 0.0–0.1)
Basophils Relative: 1 %
Eosinophils Absolute: 0.2 10*3/uL (ref 0.0–0.5)
Eosinophils Relative: 2 %
HCT: 38.8 % (ref 36.0–46.0)
Hemoglobin: 12.3 g/dL (ref 12.0–15.0)
Immature Granulocytes: 0 %
Lymphocytes Relative: 38 %
Lymphs Abs: 3.4 10*3/uL (ref 0.7–4.0)
MCH: 26.3 pg (ref 26.0–34.0)
MCHC: 31.7 g/dL (ref 30.0–36.0)
MCV: 83.1 fL (ref 80.0–100.0)
Monocytes Absolute: 0.8 10*3/uL (ref 0.1–1.0)
Monocytes Relative: 8 %
Neutro Abs: 4.7 10*3/uL (ref 1.7–7.7)
Neutrophils Relative %: 51 %
Platelets: 422 10*3/uL — ABNORMAL HIGH (ref 150–400)
RBC: 4.67 MIL/uL (ref 3.87–5.11)
RDW: 12.6 % (ref 11.5–15.5)
WBC: 9.1 10*3/uL (ref 4.0–10.5)
nRBC: 0 % (ref 0.0–0.2)

## 2021-05-26 LAB — I-STAT BETA HCG BLOOD, ED (MC, WL, AP ONLY): I-stat hCG, quantitative: <5 m[IU]/mL (ref <5)

## 2021-05-26 LAB — LIPASE, BLOOD: Lipase: 48 U/L (ref 11–51)

## 2021-05-26 LAB — TROPONIN I (HIGH SENSITIVITY): Troponin I (High Sensitivity): 3 ng/L (ref <18)

## 2021-05-26 NOTE — ED Provider Notes (Signed)
Emergency Medicine Provider Triage Evaluation Note  [name] , a 35 y.o. female  was evaluated in triage.  Pt complains of was evaluated in triage.  Pt complains of sharp chest pain that began suddenly she states it feels like a gas bubble in her chest she states it occurred at 6 PM when she was sitting on the couch with her children denies any nausea vomiting or significant shortness of breath no lightheadedness or dizziness.  She states she has a history of reflux but states that this feels somewhat different.  No history of IV drug use, cancer no leg pain or swelling states that her symptoms dramatically improved after approximately 15 minutes  States that she now has some residual 1/10 discomfort in her chest feels like a pressure nonradiating  Review of Systems  Positive: Chest pain Negative: Fever  Physical Exam   Today's Vitals   05/26/21 2010 05/26/21 2015  BP: (!) 144/100   Pulse: 80   Resp: 20   Temp: 98.3 F (36.8 C)   TempSrc: Oral   SpO2: 99%   Weight:  62.1 kg  Height:  5\' 2"  (1.575 m)  PainSc:  1    Body mass index is 25.06 kg/m.  Gen:   Awake, no distress   Resp:  Normal effort  MSK:   Moves extremities without difficulty  Other:  No unilateral leg swelling or calf tenderness. Speaking in full sentences.  No abdominal tenderness palpation.  Medical Decision Making  Medically screening exam initiated at 8:11 PM.  Appropriate orders placed.  Pt was informed that the remainder of the evaluation will be completed by another provider, this initial triage assessment does not replace that evaluation, and the importance of remaining in the ED until their evaluation is complete.  Will obtain chest pain work-up Patient agreeable to waiting in the ER will not leave prior to being seen   , Gailen Shelter 05/26/21 2018    2019, MD 05/26/21 [phone]

## 2021-05-26 NOTE — ED Notes (Signed)
Pt eloped.

## 2021-05-26 NOTE — ED Triage Notes (Signed)
Pt experienced substernal sudden onset of chest pain while sitting on the couch.  "Feels like a gas bubble."  No radiation, more pain w/ breathing in.

## 2021-06-20 ENCOUNTER — Ambulatory Visit (INDEPENDENT_AMBULATORY_CARE_PROVIDER_SITE_OTHER): Payer: 59 | Admitting: Internal Medicine

## 2021-06-20 ENCOUNTER — Encounter: Payer: Self-pay | Admitting: Internal Medicine

## 2021-06-20 VITALS — BP 122/76 | HR 84 | Ht 62.0 in | Wt 137.5 lb

## 2021-06-20 DIAGNOSIS — R079 Chest pain, unspecified: Secondary | ICD-10-CM | POA: Diagnosis not present

## 2021-06-20 DIAGNOSIS — K219 Gastro-esophageal reflux disease without esophagitis: Secondary | ICD-10-CM

## 2021-06-20 MED ORDER — OMEPRAZOLE 40 MG PO CPDR: 40.0000 mg | DELAYED_RELEASE_CAPSULE | Freq: Every day | ORAL | 11 refills | Status: DC

## 2021-06-20 NOTE — Patient Instructions (Signed)
You will be contacted by [facility] in the next 2 days to arrange a Complete abdomen ultrasound. The number on your caller ID will be [phone], please answer when they call.  If you have not heard from them in 2 days please call [phone] to schedule.    We have sent the following medications to your pharmacy for you to pick up at your convenience: Omeprazole   You have been scheduled for an endoscopy. Please follow written instructions given to you at your visit today. If you use inhalers (even only as needed), please bring them with you on the day of your procedure.  If you are age 31 or younger, your body mass index should be between 19-25. Your Body mass index is 25.15 kg/m. If this is out of the aformentioned range listed, please consider follow up with your Primary Care Provider.   __________________________________________________________  The [facility] providers would like to encourage you to use [facility] to communicate with providers for non-urgent requests or questions.  Due to long hold times on the telephone, sending your provider a message by [facility] may be a faster and more efficient way to get a response.  Please allow 48 business hours for a response.  Please remember that this is for non-urgent requests.    Gastroesophageal Reflux Disease, Adult Gastroesophageal reflux (GER) happens when acid from the stomach flows up into the tube that connects the mouth and the stomach (esophagus). Normally, food travels down the esophagus and stays in the stomach to be digested. However, when a person has GER, food and stomach acid sometimes move back up into the esophagus. If this becomes a more serious problem, the person may be diagnosed with a disease called gastroesophageal reflux disease (GERD). GERD occurs when the reflux: Happens often. Causes frequent or severe symptoms. Causes problems such as damage to the esophagus. When stomach acid comes in contact  with the esophagus, the acid may cause inflammation in the esophagus. Over time, GERD may create small holes (ulcers) in the lining of the esophagus. What are the causes? This condition is caused by a problem with the muscle between the esophagus and the stomach (lower esophageal sphincter, or LES). Normally, the LES muscle closes after food passes through the esophagus to the stomach. When the LES is weakened or abnormal, it does not close properly, and that allows food and stomach acid to go back up into the esophagus. The LES can be weakened by certain dietary substances, medicines, and medical conditions, including: Tobacco use. Pregnancy. Having a hiatal hernia. Alcohol use. Certain foods and beverages, such as coffee, chocolate, onions, and peppermint. What increases the risk? You are more likely to develop this condition if you: Have an increased body weight. Have a connective tissue disorder. Take NSAIDs, such as ibuprofen. What are the signs or symptoms? Symptoms of this condition include: Heartburn. Difficult or painful swallowing and the feeling of having a lump in the throat. A bitter taste in the mouth. Bad breath and having a large amount of saliva. Having an upset or bloated stomach and belching. Chest pain. Different conditions can cause chest pain. Make sure you see your health care provider if you experience chest pain. Shortness of breath or wheezing. Ongoing (chronic) cough or a nighttime cough. Wearing away of tooth enamel. Weight loss. How is this diagnosed? This condition may be diagnosed based on a medical history and a physical exam. To determine if you have mild or severe GERD, your health  care provider may also monitor how you respond to treatment. You may also have tests, including: A test to examine your stomach and esophagus with a small camera (endoscopy). A test that measures the acidity level in your esophagus. A test that measures how much pressure is  on your esophagus. A barium swallow or modified barium swallow test to show the shape, size, and functioning of your esophagus. How is this treated? Treatment for this condition may vary depending on how severe your symptoms are. Your health care provider may recommend: Changes to your diet. Medicine. Surgery. The goal of treatment is to help relieve your symptoms and to prevent complications. Follow these instructions at home: Eating and drinking  Follow a diet as recommended by your health care provider. This may involve avoiding foods and drinks such as: Coffee and tea, with or without caffeine. Drinks that contain alcohol. Energy drinks and sports drinks. Carbonated drinks or sodas. Chocolate and cocoa. Peppermint and mint flavorings. Garlic and onions. Horseradish. Spicy and acidic foods, including peppers, chili powder, curry powder, vinegar, hot sauces, and barbecue sauce. Citrus fruit juices and citrus fruits, such as oranges, lemons, and limes. Tomato-based foods, such as red sauce, chili, salsa, and pizza with red sauce. Fried and fatty foods, such as donuts, french fries, potato chips, and high-fat dressings. High-fat meats, such as hot dogs and fatty cuts of red and white meats, such as rib eye steak, sausage, ham, and bacon. High-fat dairy items, such as whole milk, butter, and cream cheese. Eat small, frequent meals instead of large meals. Avoid drinking large amounts of liquid with your meals. Avoid eating meals during the 2-3 hours before bedtime. Avoid lying down right after you eat. Do not exercise right after you eat. Lifestyle  Do not use any products that contain nicotine or tobacco. These products include cigarettes, chewing tobacco, and vaping devices, such as e-cigarettes. If you need help quitting, ask your health care provider. Try to reduce your stress by using methods such as yoga or meditation. If you need help reducing stress, ask your health care  provider. If you are overweight, reduce your weight to an amount that is healthy for you. Ask your health care provider for guidance about a safe weight loss goal. General instructions Pay attention to any changes in your symptoms. Take over-the-counter and prescription medicines only as told by your health care provider. Do not take aspirin, ibuprofen, or other NSAIDs unless your health care provider told you to take these medicines. Wear loose-fitting clothing. Do not wear anything tight around your waist that causes pressure on your abdomen. Raise (elevate) the head of your bed about 6 inches (15 cm). You can use a wedge to do this. Avoid bending over if this makes your symptoms worse. Keep all follow-up visits. This is important. Contact a health care provider if: You have: New symptoms. Unexplained weight loss. Difficulty swallowing or it hurts to swallow. Wheezing or a persistent cough. A hoarse voice. Your symptoms do not improve with treatment. Get help right away if: You have sudden pain in your arms, neck, jaw, teeth, or back. You suddenly feel sweaty, dizzy, or light-headed. You have chest pain or shortness of breath. You vomit and the vomit is green, yellow, or black, or it looks like blood or coffee grounds. You faint. You have stool that is red, bloody, or black. You cannot swallow, drink, or eat. These symptoms may represent a serious problem that is an emergency. Do not wait to see  if the symptoms will go away. Get medical help right away. Call your local emergency services (911 in the [place] Do not drive yourself to the hospital. Summary Gastroesophageal reflux happens when acid from the stomach flows up into the esophagus. GERD is a disease in which the reflux happens often, causes frequent or severe symptoms, or causes problems such as damage to the esophagus. Treatment for this condition may vary depending on how severe your symptoms are. Your health care provider may  recommend diet and lifestyle changes, medicine, or surgery. Contact a health care provider if you have new or worsening symptoms. Take over-the-counter and prescription medicines only as told by your health care provider. Do not take aspirin, ibuprofen, or other NSAIDs unless your health care provider told you to do so. Keep all follow-up visits as told by your health care provider. This is important. This information is not intended to replace advice given to you by your health care provider. Make sure you discuss any questions you have with your health care provider. Document Revised: 02/29/2020 Document Reviewed: 02/29/2020 Elsevier Patient Education  2022 Elsevier Inc.  Thank you for choosing me and [facility].  Dr. Marina Goodell

## 2021-06-20 NOTE — Progress Notes (Signed)
HISTORY OF PRESENT ILLNESS:  [name] is a 35 y.o. female, married mother of 2 who works as an Systems developer with [employer], who is self-referred today regarding problems with severe heartburn and back pain.  I saw the patient remotely in May 2010 regarding issues with diarrhea and lower abdominal discomfort.  She was improved on symptomatic therapies.  She has not been seen since.  Patient tells me that she has had chronic intermittent problems with heartburn for which she takes PPI and/or antacids to varying degrees over time.  She recently went to the emergency room May 26, 2021 with complaints of chest pain which she states was different than her reflux.  I have reviewed that encounter.  Blood work from that encounter including comprehensive metabolic panel, lipase, troponin, CBC, and pregnancy testing were all normal or unremarkable.  Her problem resolved within a day without recurrence.  She tells me that she had been experiencing severe heartburn for which she was taking 40 mg of Nexium in the morning.  Would have occasional breakthrough.  She also describes discomfort in her back.  No true abdominal pain.  She has had 10 pound weight loss over the past year, by intent.  No dysphagia.  No issues with her bowel habits.  She does seem to have worsening issues at night.  REVIEW OF SYSTEMS:  All non-GI ROS negative unless otherwise stated in the HPI except for back pain, itching, sleeping problems  Past Medical History:  Diagnosis Date   Allergy    Anxiety     Past Surgical History:  Procedure Laterality Date   NO PAST SURGERIES      Social History [name]  reports that she has never smoked. She has never used smokeless tobacco. She reports that she does not currently use alcohol. She reports that she does not use drugs.  family history includes Crohn's disease in her mother; Glaucoma in her father; Hepatitis C in her father; Lung cancer in her paternal grandfather;  Ovarian cancer in her maternal grandmother.  Allergies  Allergen Reactions   Shellfish Allergy Diarrhea       PHYSICAL EXAMINATION: Vital signs: BP 122/76   Pulse 84   Ht 5\' 2"  (1.575 m)   Wt 137 lb 8 oz (62.4 kg)   SpO2 100%   BMI 25.15 kg/m   Constitutional: generally well-appearing, no acute distress Psychiatric: alert and oriented x3, cooperative Eyes: extraocular movements intact, anicteric, conjunctiva pink Mouth: oral pharynx moist, no lesions Neck: supple no lymphadenopathy Cardiovascular: heart regular rate and rhythm, no murmur Lungs: clear to auscultation bilaterally Abdomen: soft, nontender, nondistended, no obvious ascites, no peritoneal signs, normal bowel sounds, no organomegaly Rectal: Omitted Extremities: no clubbing, cyanosis, or lower extremity edema bilaterally Skin: no lesions on visible extremities Neuro: No focal deficits.  Cranial nerves intact  ASSESSMENT:  1.  Chronic GERD.  Intermittent issues with breakthrough despite PPI. 2.  Back pain.  Question GERD equivalent.  Question other.   PLAN:  1.  Prescribe omeprazole 40 mg daily.  Take every day in the morning 30 to 60 minutes before breakfast.  Medication risks reviewed 2.  Schedule upper endoscopy to evaluate reflux intermittently refractory to PPI.The nature of the procedure, as well as the risks, benefits, and alternatives were carefully and thoroughly reviewed with the patient. Ample time for discussion and questions allowed. The patient understood, was satisfied, and agreed to proceed.  3.  Schedule abdominal ultrasound.  Rule out gallstones. 4.  Reflux precautions.  Reviewed 5.  Clinical follow-up after the above to be determined

## 2021-07-04 ENCOUNTER — Ambulatory Visit (HOSPITAL_COMMUNITY)
Admission: RE | Admit: 2021-07-04 | Discharge: 2021-07-04 | Disposition: A | Discharge status: Home / Self Care | Payer: 59 | Source: Ambulatory Visit | Attending: Internal Medicine | Admitting: Internal Medicine

## 2021-07-04 DIAGNOSIS — K219 Gastro-esophageal reflux disease without esophagitis: Secondary | ICD-10-CM | POA: Insufficient documentation

## 2021-07-04 DIAGNOSIS — R079 Chest pain, unspecified: Secondary | ICD-10-CM | POA: Insufficient documentation

## 2021-07-05 ENCOUNTER — Telehealth: Payer: Self-pay | Admitting: Internal Medicine

## 2021-07-05 NOTE — Telephone Encounter (Signed)
See result note.  

## 2021-07-05 NOTE — Telephone Encounter (Signed)
Patient is returning your call.  

## 2021-07-10 ENCOUNTER — Other Ambulatory Visit: Payer: Self-pay

## 2021-07-10 ENCOUNTER — Encounter: Payer: Self-pay | Admitting: Internal Medicine

## 2021-07-10 ENCOUNTER — Ambulatory Visit (AMBULATORY_SURGERY_CENTER): Payer: 59 | Admitting: Internal Medicine

## 2021-07-10 VITALS — BP 138/89 | HR 78 | Temp 97.7°F | Resp 15 | Ht 62.0 in | Wt 137.0 lb

## 2021-07-10 DIAGNOSIS — R079 Chest pain, unspecified: Secondary | ICD-10-CM

## 2021-07-10 DIAGNOSIS — K219 Gastro-esophageal reflux disease without esophagitis: Secondary | ICD-10-CM

## 2021-07-10 MED ORDER — HYOSCYAMINE SULFATE 0.125 MG SL SUBL: 0.1250 mg | SUBLINGUAL_TABLET | SUBLINGUAL | 2 refills | Status: DC | PRN

## 2021-07-10 MED ORDER — SODIUM CHLORIDE 0.9 % IV SOLN: 500.0000 mL | Freq: Once | INTRAVENOUS | Status: AC

## 2021-07-10 NOTE — Op Note (Signed)
[facility] Patient Name: [name] Procedure Date: 07/10/2021 10:17 AM MRN: [id number] Endoscopist: Wilhemina Bonito. Marina Goodell , MD Age: 35 Referring MD:  Date of Birth: 1986/03/26 Gender: Female Account #: [id number] Procedure:                Upper GI endoscopy Indications:              Esophageal reflux, Chest pain (non cardiac)/back                            pain Medicines:                Monitored Anesthesia Care Procedure:                Pre-Anesthesia Assessment:                           - Prior to the procedure, a History and Physical                            was performed, and patient medications and                            allergies were reviewed. The patient's tolerance of                            previous anesthesia was also reviewed. The risks                            and benefits of the procedure and the sedation                            options and risks were discussed with the patient.                            All questions were answered, and informed consent                            was obtained. Prior Anticoagulants: The patient has                            taken no previous anticoagulant or antiplatelet                            agents. ASA Grade Assessment: II - A patient with                            mild systemic disease. After reviewing the risks                            and benefits, the patient was deemed in                            satisfactory condition to undergo the procedure.  After obtaining informed consent, the endoscope was                            passed under direct vision. Throughout the                            procedure, the patient's blood pressure, pulse, and                            oxygen saturations were monitored continuously. The                            Endoscope was introduced through the mouth, and                            advanced to the second part of duodenum. The upper                             GI endoscopy was accomplished without difficulty.                            The patient tolerated the procedure well. Scope In: Scope Out: Findings:                 The esophagus was normal.                           The stomach was normal.                           The examined duodenum was normal.                           The cardia and gastric fundus were normal on                            retroflexion. Complications:            No immediate complications. Estimated Blood Loss:     Estimated blood loss: none. Impression:               1. Normal EGD                           2. No obvious cause for back pain found. Question                            spasm. Question musculoskeletal. Recommendation:           - Patient has a contact number available for                            emergencies. The signs and symptoms of potential                            delayed complications were discussed with the  patient. Return to normal activities tomorrow.                            Written discharge instructions were provided to the                            patient.                           - Resume previous diet.                           - Continue present medications.                           - Prescribed Levsin (generic equivalent) sublingual                            0.125 mg; #30; take 1 or 2 sublingually every 4                            hours as needed for pain; 2 refills                           - GI follow-up 6 weeks Farrell Broerman N. Marina Goodell, MD 07/10/2021 10:43:33 AM This report has been signed electronically.

## 2021-07-10 NOTE — Progress Notes (Signed)
Pt's states no medical or surgical changes since previsit or office visit. Vitals done by DT. 

## 2021-07-10 NOTE — Progress Notes (Signed)
To PACU, VSS. Report to [email or web address] 

## 2021-07-10 NOTE — Patient Instructions (Addendum)
YOU HAD AN ENDOSCOPIC PROCEDURE TODAY AT THE [facility]:   Refer to the procedure report that was given to you for any specific questions about what was found during the examination.  If the procedure report does not answer your questions, please call your gastroenterologist to clarify.  If you requested that your care partner not be given the details of your procedure findings, then the procedure report has been included in a sealed envelope for you to review at your convenience later.  YOU SHOULD EXPECT: Some feelings of bloating in the abdomen. Passage of more gas than usual.  Walking can help get rid of the air that was put into your GI tract during the procedure and reduce the bloating. If you had a lower endoscopy (such as a colonoscopy or flexible sigmoidoscopy) you may notice spotting of blood in your stool or on the toilet paper. If you underwent a bowel prep for your procedure, you may not have a normal bowel movement for a few days.  Please Note:  You might notice some irritation and congestion in your nose or some drainage.  This is from the oxygen used during your procedure.  There is no need for concern and it should clear up in a day or so.  SYMPTOMS TO REPORT IMMEDIATELY:  Following upper endoscopy (EGD)  Vomiting of blood or coffee ground material  New chest pain or pain under the shoulder blades  Painful or persistently difficult swallowing  New shortness of breath  Fever of 100F or higher  Black, tarry-looking stools  For urgent or emergent issues, a gastroenterologist can be reached at any hour by calling [phone]. Do not use MyChart messaging for urgent concerns.    DIET:  We do recommend a small meal at first, but then you may proceed to your regular diet.  Drink plenty of fluids but you should avoid alcoholic beverages for 24 hours.  MEDICATIONS: Continue present medications. Start Levsin (or generic equivalent) sublingual 0.125mg , take 1 or 2  sublingually (place under the tongue and allow to dissolve) every 4 hours as needed for pain (#30 with 2 refills).  FOLLOW UP: Follow up with Dr. Marina Goodell in his office in 6 weeks. Appointment made for 08/22/21 at 3:00pm with Dr. Marina Goodell in his office.  Thank you for allowing [place] to provide for your healthcare needs today.  ACTIVITY:  You should plan to take it easy for the rest of today and you should NOT DRIVE or use heavy machinery until tomorrow (because of the sedation medicines used during the test).    FOLLOW UP: Our staff will call the number listed on your records 48-72 hours following your procedure to check on you and address any questions or concerns that you may have regarding the information given to you following your procedure. If we do not reach you, we will leave a message.  We will attempt to reach you two times.  During this call, we will ask if you have developed any symptoms of COVID 19. If you develop any symptoms (ie: fever, flu-like symptoms, shortness of breath, cough etc.) before then, please call [phone].  If you test positive for Covid 19 in the 2 weeks post procedure, please call and report this information to [place].    If any biopsies were taken you will be contacted by phone or by letter within the next 1-3 weeks.  Please call us at [phone] if you have not heard about the biopsies in 3 weeks.  SIGNATURES/CONFIDENTIALITY: You and/or your care partner have signed paperwork which will be entered into your electronic medical record.  These signatures attest to the fact that that the information above on your After Visit Summary has been reviewed and is understood.  Full responsibility of the confidentiality of this discharge information lies with you and/or your care-partner.

## 2021-07-10 NOTE — Progress Notes (Signed)
GI PREPROCEDURE H&P  Patient was seen in the office June 20, 2021 regarding chronic GERD and back pain of uncertain etiology.  See that complete H&P.  No interval changes.  Assessment and plan as outlined below:  ASSESSMENT:   1.  Chronic GERD.  Intermittent issues with breakthrough despite PPI. 2.  Back pain.  Question GERD equivalent.  Question other.     PLAN:   1.  Prescribe omeprazole 40 mg daily.  Take every day in the morning 30 to 60 minutes before breakfast.  Medication risks reviewed 2.  Schedule upper endoscopy to evaluate reflux intermittently refractory to PPI.The nature of the procedure, as well as the risks, benefits, and alternatives were carefully and thoroughly reviewed with the patient. Ample time for discussion and questions allowed. The patient understood, was satisfied, and agreed to proceed.  3.  Schedule abdominal ultrasound.  Rule out gallstones. 4.  Reflux precautions.  Reviewed 5.  Clinical follow-up after the above to be determined  Addendum: The ultrasound did NOT show gallstones.  No significant abnormalities on ultrasound.

## 2021-07-12 ENCOUNTER — Telehealth: Payer: Self-pay

## 2021-07-12 NOTE — Telephone Encounter (Signed)
  Follow up Call-  Call back number 07/10/2021  Post procedure Call Back phone  # [phone]  Permission to leave phone message Yes  Some recent data might be hidden     Patient questions:  Do you have a fever, pain , or abdominal swelling? No. Pain Score  0 *  Have you tolerated food without any problems? Yes.    Have you been able to return to your normal activities? Yes.    Do you have any questions about your discharge instructions: Diet   No. Medications  No. Follow up visit  No.  Do you have questions or concerns about your Care? No.  Actions: * If pain score is 4 or above: No action needed, pain <4.  Have you developed a fever since your procedure? no  2.   Have you had an respiratory symptoms (SOB or cough) since your procedure? no  3.   Have you tested positive for COVID 19 since your procedure no  4.   Have you had any family members/close contacts diagnosed with the COVID 19 since your procedure?  no   If yes to any of these questions please route to Laverna Peace, RN and Karlton Lemon, RN

## 2021-08-22 ENCOUNTER — Encounter: Payer: Self-pay | Admitting: Internal Medicine

## 2021-08-22 ENCOUNTER — Ambulatory Visit (INDEPENDENT_AMBULATORY_CARE_PROVIDER_SITE_OTHER): Payer: 59 | Admitting: Internal Medicine

## 2021-08-22 VITALS — BP 100/70 | HR 94 | Ht 62.0 in | Wt 140.6 lb

## 2021-08-22 DIAGNOSIS — K219 Gastro-esophageal reflux disease without esophagitis: Secondary | ICD-10-CM

## 2021-08-22 DIAGNOSIS — R079 Chest pain, unspecified: Secondary | ICD-10-CM | POA: Diagnosis not present

## 2021-08-22 MED ORDER — OMEPRAZOLE 40 MG PO CPDR
40.0000 mg | DELAYED_RELEASE_CAPSULE | Freq: Every day | ORAL | 11 refills | Status: DC
Start: 2021-08-22 — End: 2021-08-23

## 2021-08-22 NOTE — Progress Notes (Signed)
HISTORY OF PRESENT ILLNESS:  [name] is a 35 y.o. 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  She was evaluated June 20, 2021 with chronic GERD with breakthrough symptoms as well as intermittent back pain of uncertain etiology.  See that dictation.  She was prescribed omeprazole 40 mg daily.  She has been compliant with therapy.  She subsequently underwent abdominal ultrasound and upper endoscopy.  Ultrasound was unremarkable.  Endoscopy was normal.  She was prescribed Levsin for possible spasm related pain.  Follow-up at this time requested.  The patient tells me that she has been compliant with medical therapy and is taking the medication at the proper time.  She has had no significant reflux except for 1 occasion when under stress.  She did have 1 episode of back discomfort around Thanksgiving for which she took Levsin.  This worked rather quickly.  No problems since.  She is pleased.  No new complaints.  REVIEW OF SYSTEMS:  All non-GI ROS negative.  Past Medical History:  Diagnosis Date   Allergy    Anxiety     Past Surgical History:  Procedure Laterality Date   NO PAST SURGERIES      Social History [name]  reports that she has never smoked. She has never used smokeless tobacco. She reports that she does not currently use alcohol. She reports that she does not use drugs.  family history includes Crohn's disease in her mother; Glaucoma in her father; Hepatitis C in her father; Lung cancer in her paternal grandfather; Ovarian cancer in her maternal grandmother.  Allergies  Allergen Reactions   Shellfish Allergy Diarrhea       PHYSICAL EXAMINATION: Vital signs: BP 100/70    Pulse 94    Ht 5\' 2"  (1.575 m)    Wt 140 lb 9.6 oz (63.8 kg)    SpO2 99%    BMI 25.72 kg/m   Constitutional: generally well-appearing, no acute distress Psychiatric: alert and oriented x3,  cooperative Eyes: Anicteric Mouth: Mask Abdomen: Not reexamined Skin: no lesions on visible extremities Neuro: Grossly intact  ASSESSMENT:  1.  GERD.  Improved on omeprazole 40 mg daily 2.  Problems with back pain.  Possible GERD equivalent.  Possible spasm.  Normal EGD and ultrasound.  Possible other.  In any event, doing better   PLAN:  1.  Continue omeprazole 40 mg daily.  Prescribed.  Medication risks reviewed 2.  Reflux precautions 3.  Continue sublingual Levsin as needed 4.  Routine office follow-up 6 months.  Contact the office in the interim for any questions or problems

## 2021-08-22 NOTE — Patient Instructions (Signed)
We have sent the following medications to your pharmacy for you to pick up at your convenience: Omeprazole   Follow up in 6 months.Office to call schedule at a later time.   If you are age 36 or younger, your body mass index should be between 19-25. Your Body mass index is 25.72 kg/m. If this is out of the aformentioned range listed, please consider follow up with your Primary Care Provider.   ________________________________________________________  The [facility] providers would like to encourage you to use [facility] to communicate with providers for non-urgent requests or questions.  Due to long hold times on the telephone, sending your provider a message by [facility] may be a faster and more efficient way to get a response.  Please allow 48 business hours for a response.  Please remember that this is for non-urgent requests.  _______________________________________________________  Thank you for choosing me and [facility].  Dr. Yancey Flemings

## 2021-08-23 ENCOUNTER — Other Ambulatory Visit: Payer: Self-pay

## 2021-08-23 MED ORDER — OMEPRAZOLE 40 MG PO CPDR: 40.0000 mg | DELAYED_RELEASE_CAPSULE | Freq: Every day | ORAL | 3 refills | Status: DC

## 2021-11-22 IMAGING — CR DG CHEST 2V
2 series · 2 of 2 positions shown · non-contrast
Comparison: None.

CLINICAL DATA: Chest pain.

EXAM:
CHEST - 2 VIEW

[chest pa]
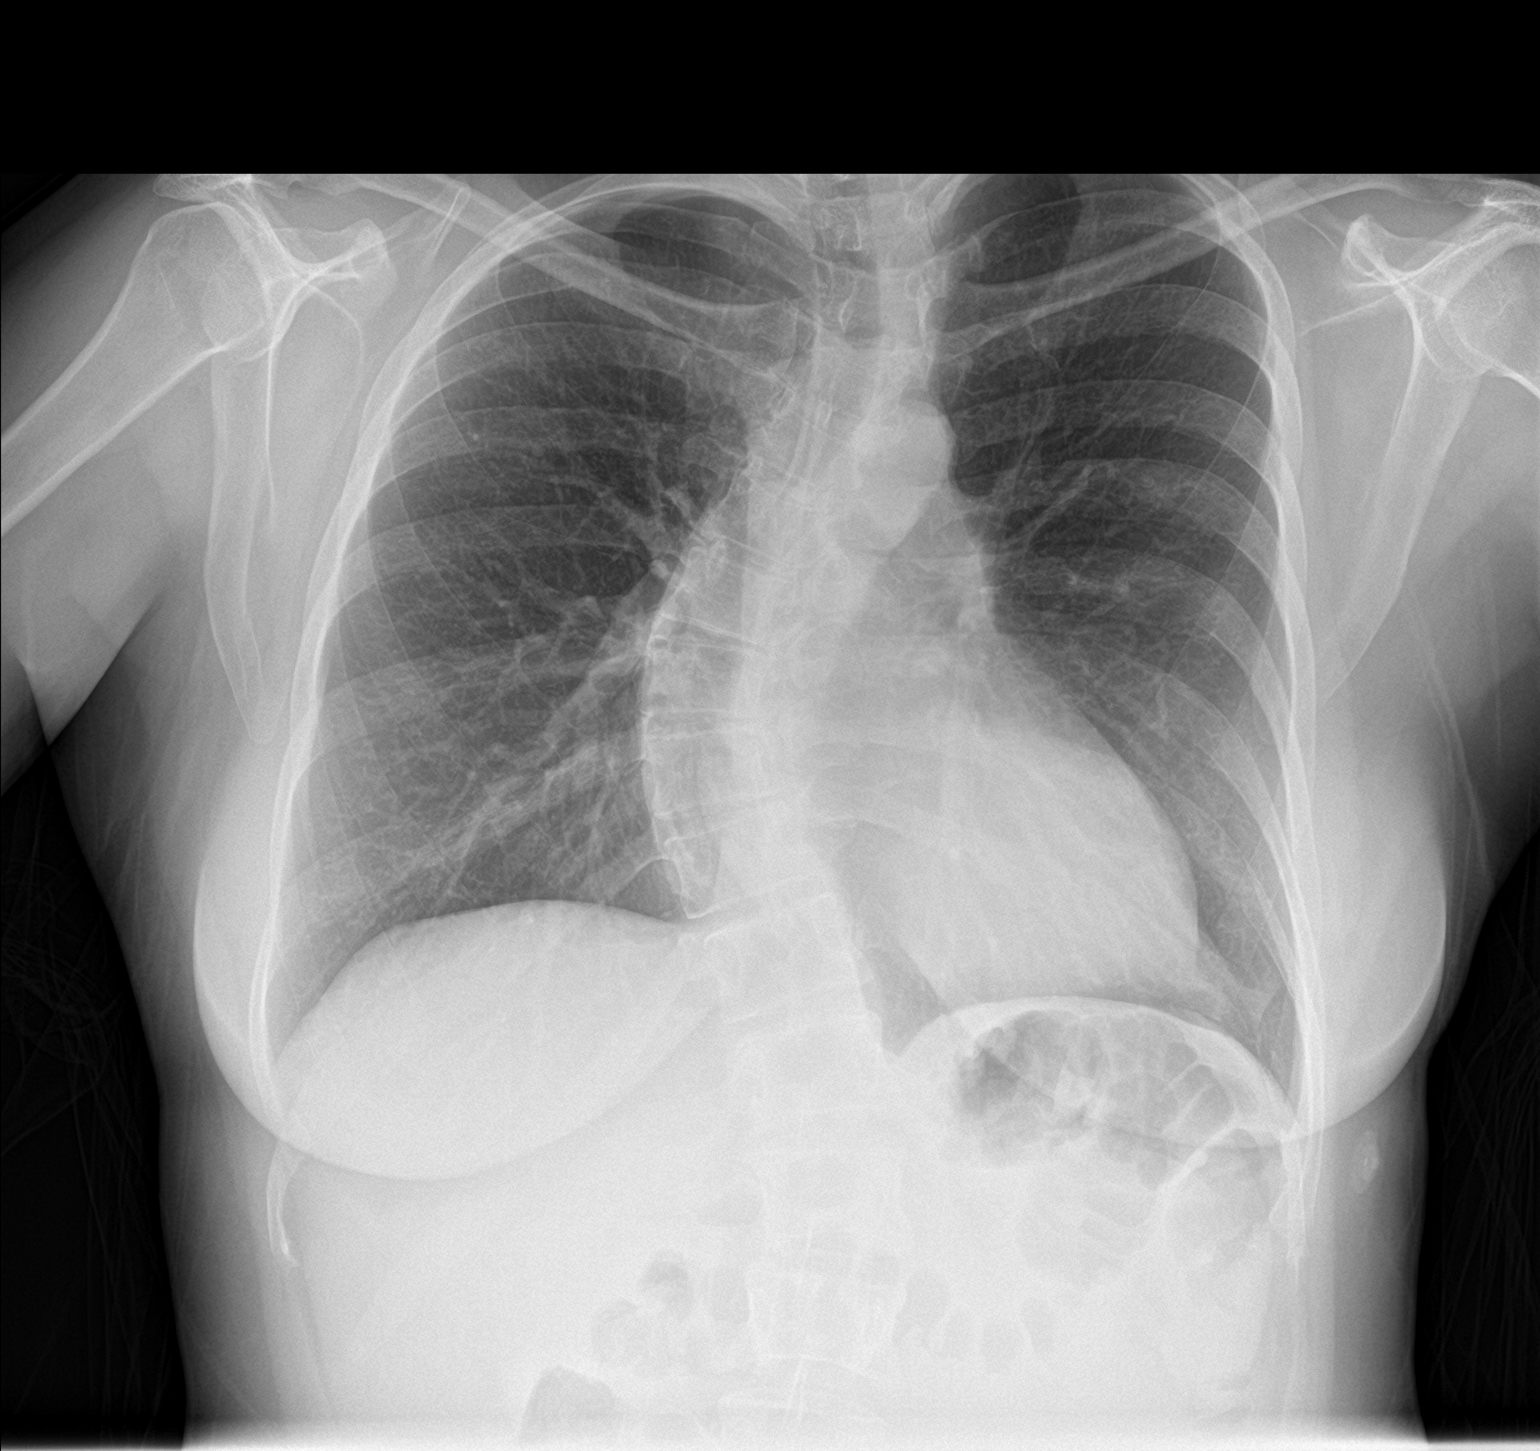

[chest lat]
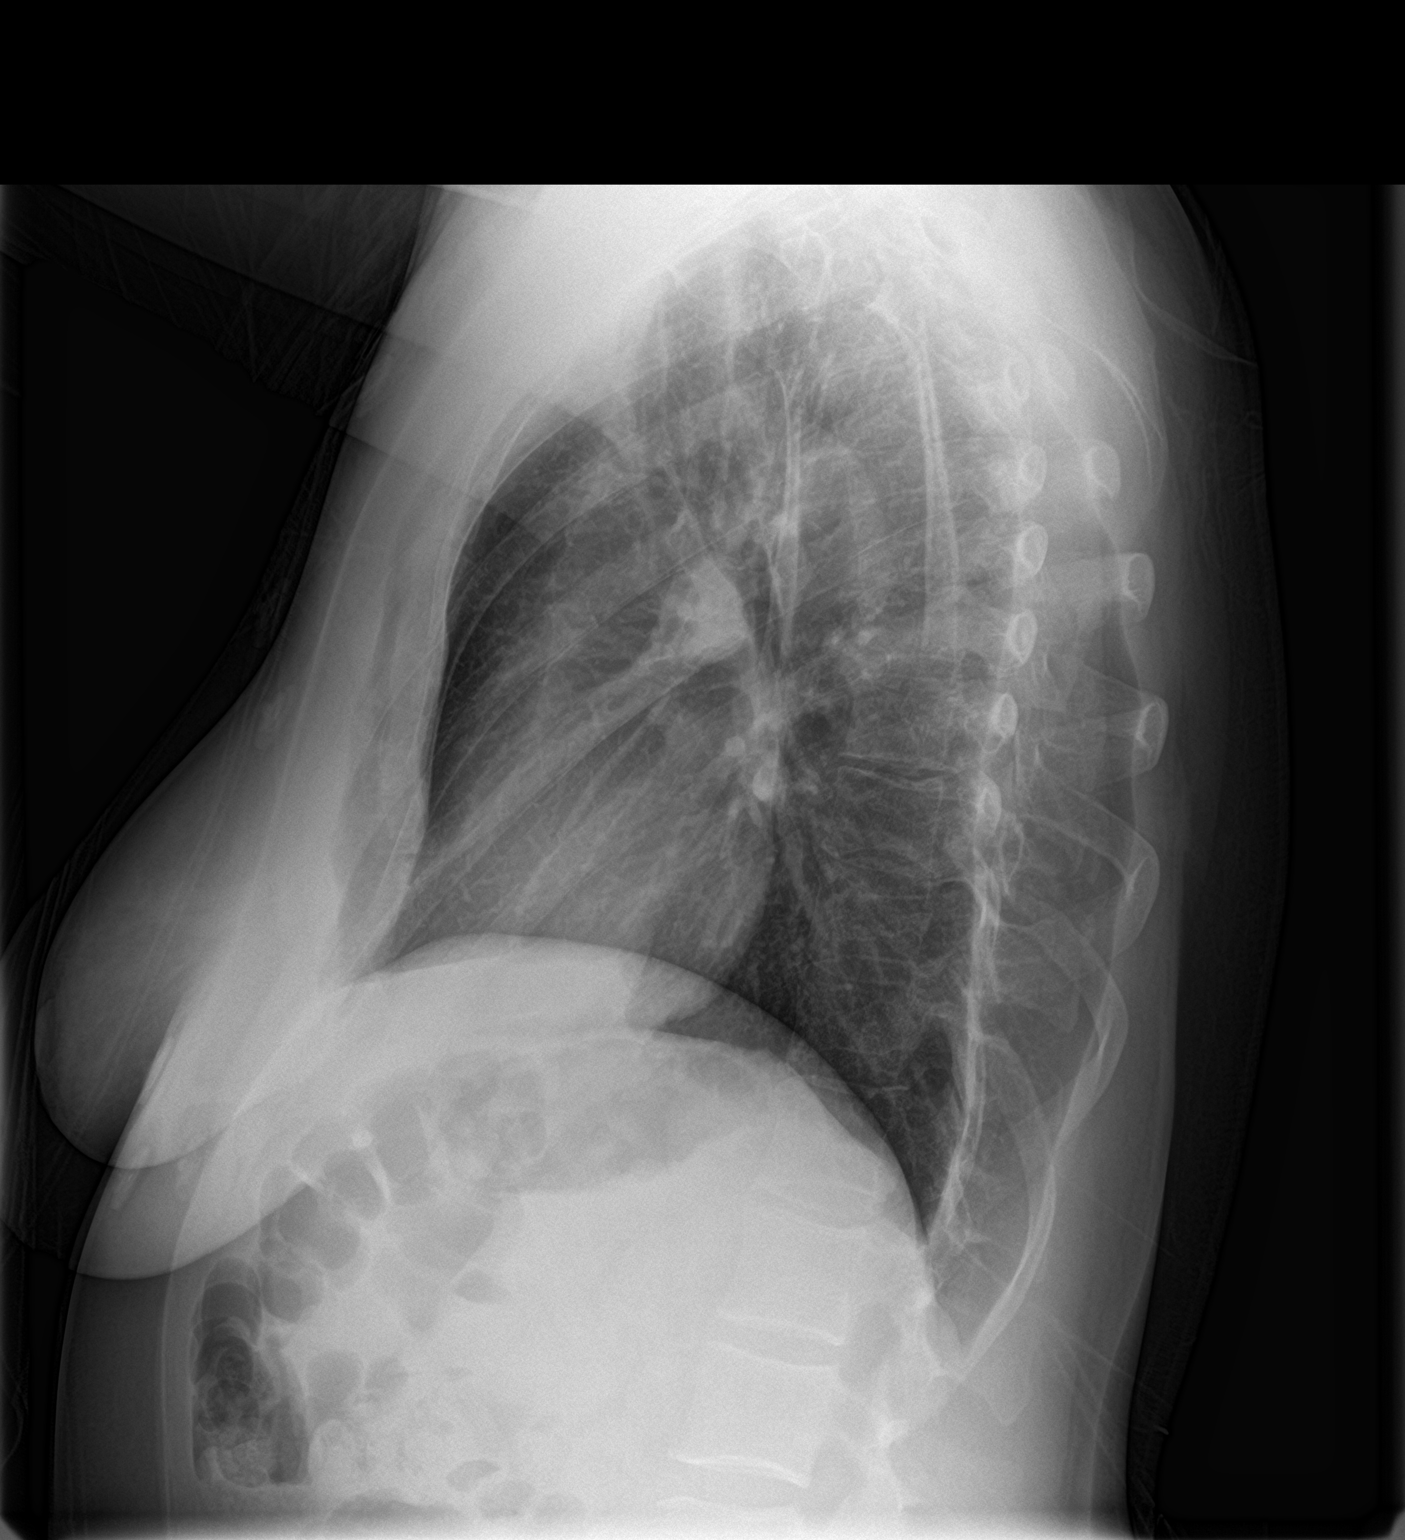

[2 of 2 positions shown; findings below may reference images not displayed]

FINDINGS: The heart size and mediastinal contours are within normal limits.
Both lungs are clear. Marked severity dextroscoliosis of the mid to
lower thoracic spine is seen.
IMPRESSION: No active cardiopulmonary disease.

## 2021-12-31 IMAGING — US US ABDOMEN COMPLETE
1 series · 13 of 25 positions shown · non-contrast
Comparison: None.

CLINICAL DATA: Atypical chest pain.

EXAM:
ABDOMEN ULTRASOUND COMPLETE

[Series 1: us abdomen complete · 13 of 89 slices shown]
[im 1/89]
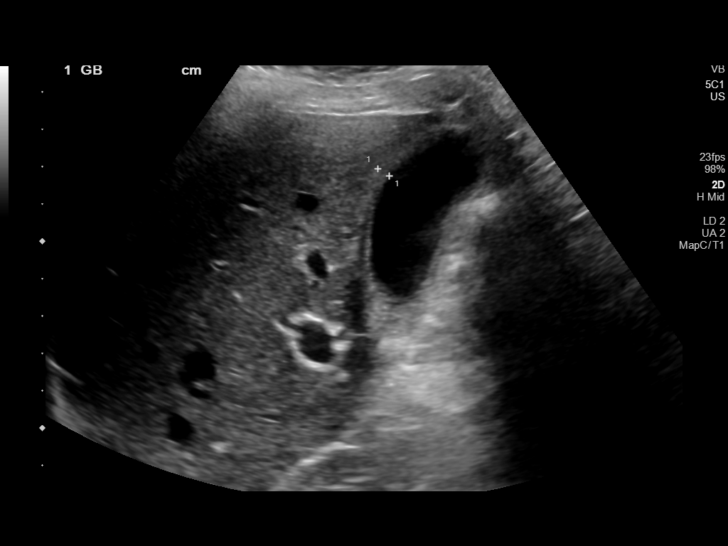
[im 8/89]
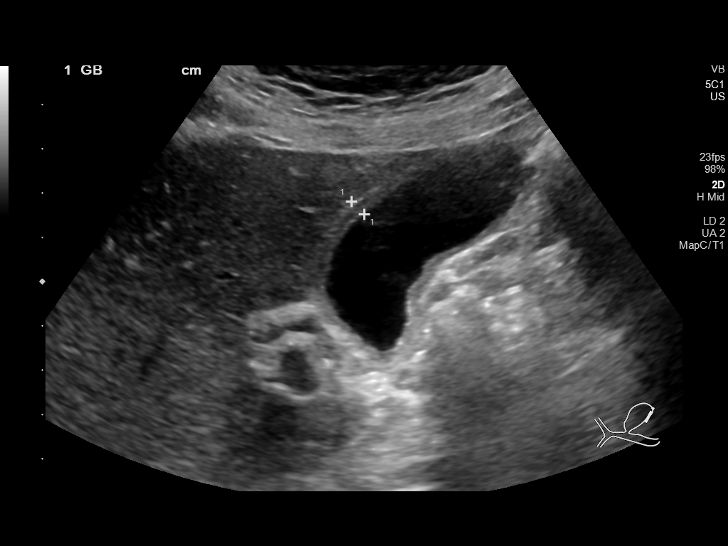
[im 15/89]
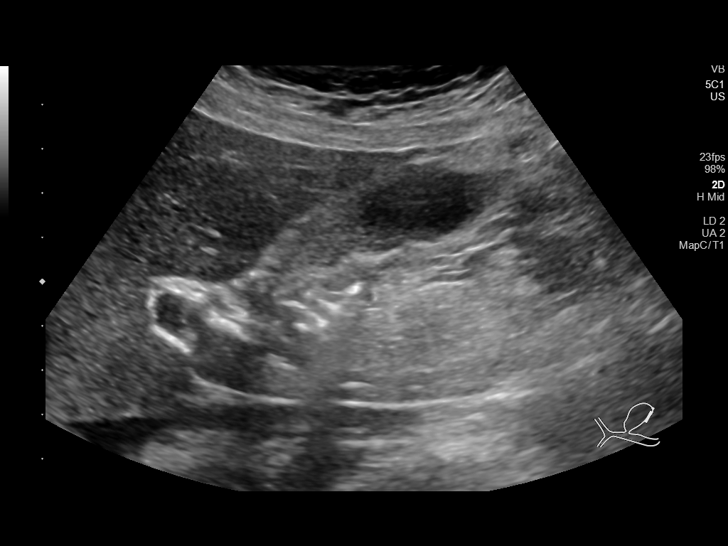
[im 23/89]
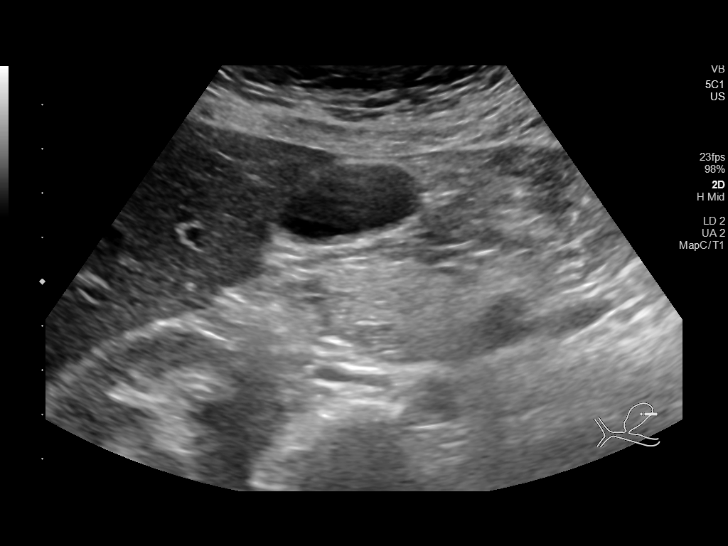
[im 30/89]
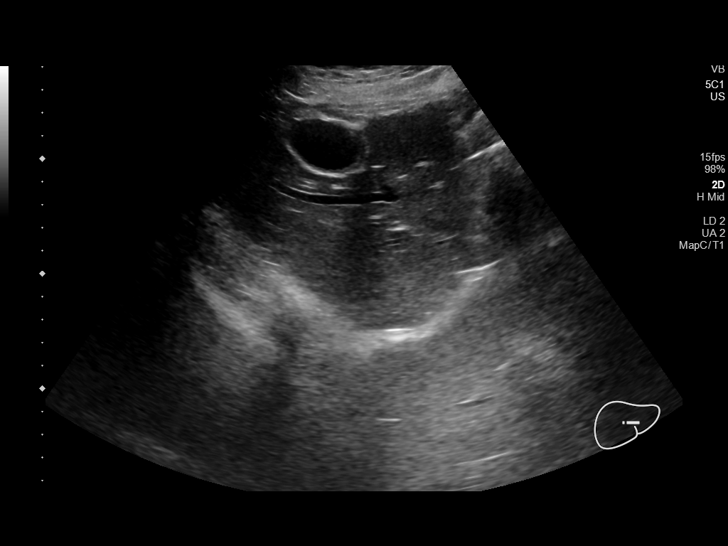
[im 37/89]
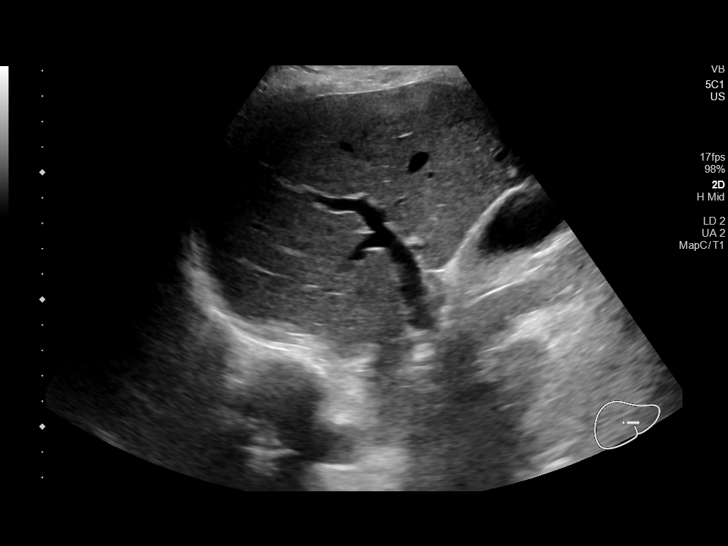
[im 45/89]
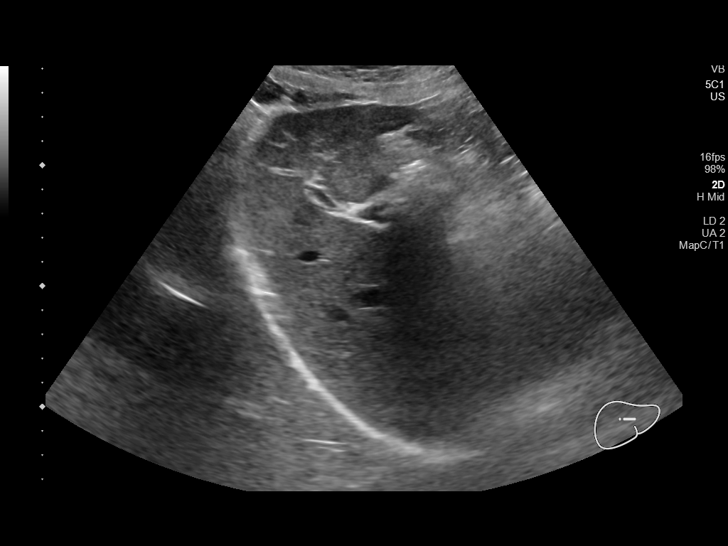
[im 52/89]
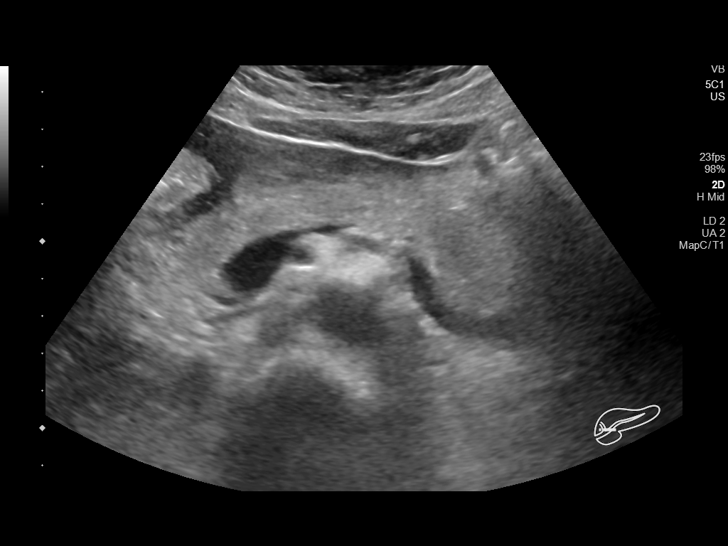
[im 59/89]
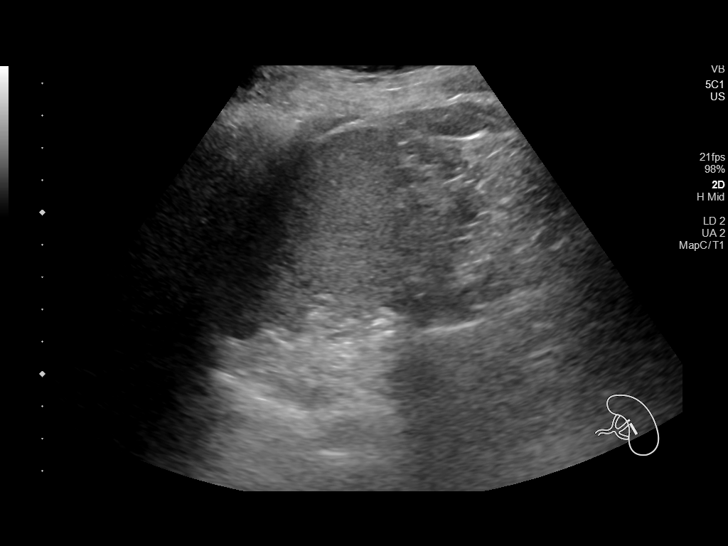
[im 67/89]
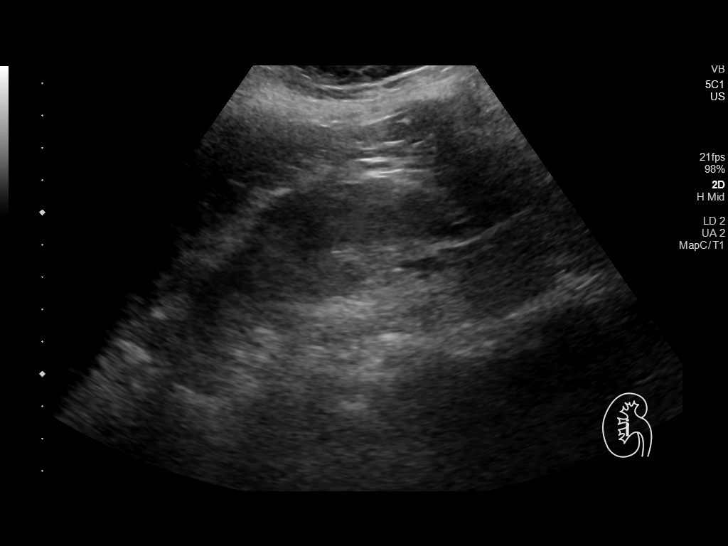
[im 74/89]
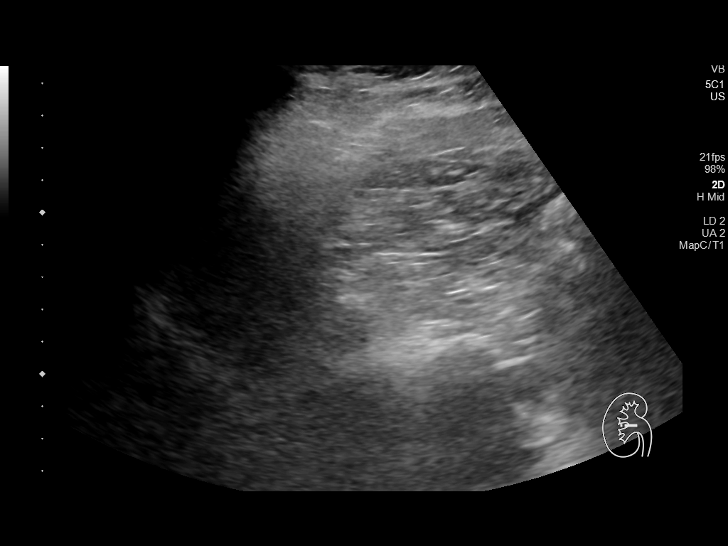
[im 81/89]
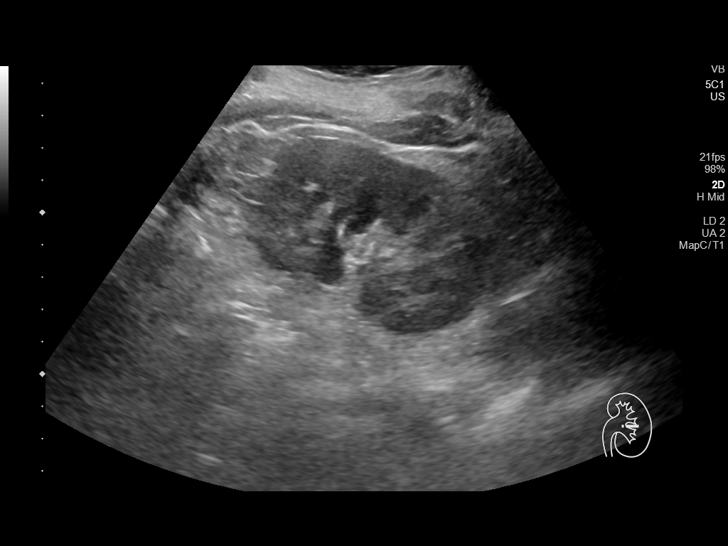
[im 89/89]
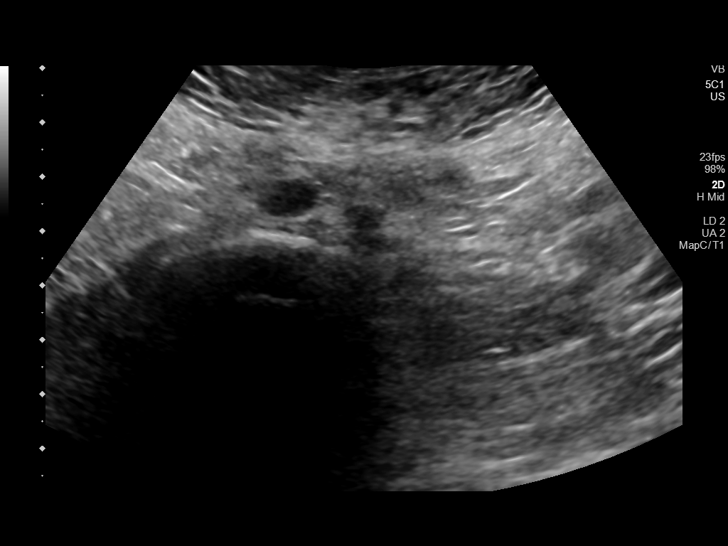

[13 of 25 positions shown; findings below may reference images not displayed]

FINDINGS: Gallbladder: No gallstones are identified. Gallbladder wall
thickness is at the upper limits of normal measuring 4 mm. No
evidence of pericholecystic fluid. No sonographic Murphy sign noted
by sonographer. A non-mobile 4 mm polyp is noted near the
gallbladder neck.

Common bile duct: Diameter: 3 mm, within normal limits.

Liver: No focal lesion identified. Within normal limits in
parenchymal echogenicity. Portal vein is patent on color Doppler
imaging with normal direction of blood flow towards the liver.

IVC: No abnormality visualized.

Pancreas: Visualized portion unremarkable.

Spleen: Size and appearance within normal limits.

Right Kidney: Length: 10.5 cm. Echogenicity within normal limits. No
mass or hydronephrosis visualized.

Left Kidney: Length: 11.0 cm. Echogenicity within normal limits. No
mass or hydronephrosis visualized.

Abdominal aorta: No aneurysm visualized.

Other findings: None.
IMPRESSION: No evidence of cholelithiasis or biliary ductal dilatation.

4 mm gallbladder polyp. No follow-up imaging recommended. This
recommendation follows ACR consensus guidelines: White Paper of the
ACR Incidental Findings Committee II on Gallbladder and Biliary
Findings. [HOSPITAL] 1339:;[DATE].

## 2022-03-26 ENCOUNTER — Encounter: Payer: Self-pay | Admitting: Internal Medicine

## 2022-07-16 ENCOUNTER — Encounter: Payer: Self-pay | Admitting: Physician Assistant

## 2022-07-16 ENCOUNTER — Ambulatory Visit (INDEPENDENT_AMBULATORY_CARE_PROVIDER_SITE_OTHER): Payer: 59 | Admitting: Physician Assistant

## 2022-07-16 VITALS — BP 122/84 | HR 94 | Ht 62.0 in | Wt 139.0 lb

## 2022-07-16 DIAGNOSIS — K219 Gastro-esophageal reflux disease without esophagitis: Secondary | ICD-10-CM | POA: Diagnosis not present

## 2022-07-16 MED ORDER — OMEPRAZOLE 40 MG PO CPDR: 40.0000 mg | DELAYED_RELEASE_CAPSULE | Freq: Every day | ORAL | 3 refills | Status: DC

## 2022-07-16 MED ORDER — HYOSCYAMINE SULFATE 0.125 MG SL SUBL: 0.1250 mg | SUBLINGUAL_TABLET | SUBLINGUAL | 3 refills | Status: DC | PRN

## 2022-07-16 NOTE — Progress Notes (Signed)
Noted  

## 2022-07-16 NOTE — Progress Notes (Signed)
Chief Complaint: Follow-up GERD  HPI:    [name] is a 36 year old female with past medical history as listed below, known to Dr. Marina Goodell, who presents to clinic today for follow-up of her GERD.    08/22/2021 patient seen in clinic by Dr. Marina Goodell at that time she was following up from her previous evaluation 06/20/2021 for GERD.  She had been given Omeprazole 40 mg daily and also had an abdominal ultrasound which was unremarkable.  Endoscopy unremarkable.  She was given Levsin for possible spasm related pain.  At time of that follow-up she had no significant reflux.  She was continued on Omeprazole 40 mg daily and sublingual Levsin as needed.    Today, the patient tells me that symptoms are much much improved on her current medication regimen of Omeprazole 40 mg daily and Hyoscyamine 0.125 mg tabs 1-2 tabs every 4-6 hours as needed.  She tells me that in general she rarely has a flare but when she does she takes the Hyoscyamine typically 1-2 tabs every 4 hours or so and this takes care of the pain.  She had a flare the past week but it had been weeks before that previously, she thinks she flared from eating Halloween candy.  In general is very happy and would like to continue her current regimen.    Patient has 2 little boys a 70-year-old and a 91-year-old.    Denies fever, chills, weight loss, change in bowel habits, symptoms that awaken her from sleep, nausea or vomiting.  Past Medical History:  Diagnosis Date   Allergy    Anxiety     Past Surgical History:  Procedure Laterality Date   NO PAST SURGERIES      Current Outpatient Medications  Medication Sig Dispense Refill   hyoscyamine (LEVSIN SL) 0.125 MG SL tablet Place 1 tablet (0.125 mg total) under the tongue every 4 (four) hours as needed. 30 tablet 2   JUNEL 1.5/30 1.5-30 MG-MCG tablet Take 1 tablet by mouth daily.     omeprazole (PRILOSEC) 40 MG capsule Take 1 capsule (40 mg total) by mouth daily. 90 capsule 3   rosuvastatin  (CRESTOR) 20 MG tablet Take 20 mg by mouth once a week.     Current Facility-Administered Medications  Medication Dose Route Frequency Provider Last Rate Last Admin   0.9 %  sodium chloride infusion  500 mL Intravenous Once Hilarie Fredrickson, MD        Allergies as of 07/16/2022 - Review Complete 07/16/2022  Allergen Reaction Noted   Shellfish allergy Diarrhea 01/04/2019    Family History  Problem Relation Age of Onset   Crohn's disease Mother    Glaucoma Father    Hepatitis C Father    Ovarian cancer Maternal Grandmother    Lung cancer Paternal Grandfather    Colon cancer Neg Hx    Diabetes Neg Hx    Esophageal cancer Neg Hx    Pancreatic cancer Neg Hx    Stomach cancer Neg Hx     Social History   Socioeconomic History   Marital status: Married    Spouse name: Not on file   Number of children: Not on file   Years of education: Not on file   Highest education level: Not on file  Occupational History   Not on file  Tobacco Use   Smoking status: Never   Smokeless tobacco: Never  Vaping Use   Vaping Use: Never used  Substance and Sexual Activity   Alcohol  use: Not Currently    Comment: rare   Drug use: No   Sexual activity: Yes    Birth control/protection: Pill  Other Topics Concern   Not on file  Social History Narrative   Not on file   Social Determinants of Health   Financial Resource Strain: Not on file  Food Insecurity: Not on file  Transportation Needs: Not on file  Physical Activity: Not on file  Stress: Not on file  Social Connections: Not on file  Intimate Partner Violence: Not on file    Review of Systems:    Constitutional: No weight loss, fever or chills Cardiovascular: No chest pain  Respiratory: No SOB  Gastrointestinal: See HPI and otherwise negative   Physical Exam:  Vital signs: BP 122/84   Pulse 94   Ht 5\' 2"  (1.575 m)   Wt 139 lb (63 kg)   BMI 25.42 kg/m   Constitutional:   Pleasant Caucasian female appears to be in NAD, Well  developed, Well nourished, alert and cooperative Respiratory: Respirations even and unlabored. Lungs clear to auscultation bilaterally.   No wheezes, crackles, or rhonchi.  Cardiovascular: Normal S1, S2. No MRG. Regular rate and rhythm. No peripheral edema, cyanosis or pallor.  Gastrointestinal:  Soft, nondistended, nontender. No rebound or guarding. Normal bowel sounds. No appreciable masses or hepatomegaly. Rectal:  Not performed.  Psychiatric: Demonstrates good judgement and reason without abnormal affect or behaviors.  No recent labs or imaging.  Assessment: 1.  GERD: Moderately controlled on Omeprazole 40 mg daily and as needed Hyoscyamine 0 1.25 mg sublingual tabs, prior work-up including abdominal ultrasound and EGD unrevealing  Plan: 1.  Continue current regimen.  Refilled Omeprazole 40 mg daily, #90 with 3 refills to the [facility]. 2.  Refilled Hyoscyamine 0.125 mg sublingual tabs 1-2 tabs every 4-6 hours as needed for breakthrough pain #90 with 3 refills to the local pharmacy per her request. 3.  Did discuss that she could try over-the-counter Famotidine 20 mg as needed as well if she is having breakthrough symptoms. 4.  Patient to follow in clinic in a year with Dr. Henrene Pastor or sooner if necessary.  Jessica Newer, PA-C [facility] 07/16/2022, 1:58 PM  Cc: Burnard Bunting, MD

## 2022-07-16 NOTE — Patient Instructions (Addendum)
Follow up in a  year or sooner if needed.  It was good to meet you, Hyacinth Meeker, PA-C

## 2023-07-03 ENCOUNTER — Other Ambulatory Visit: Payer: Self-pay | Admitting: Physician Assistant

## 2023-07-23 ENCOUNTER — Emergency Department (HOSPITAL_BASED_OUTPATIENT_CLINIC_OR_DEPARTMENT_OTHER)
Admission: EM | Admit: 2023-07-23 | Discharge: 2023-07-24 | Disposition: A | Discharge status: Home / Self Care | Payer: 59 | Attending: Emergency Medicine | Admitting: Emergency Medicine

## 2023-07-23 ENCOUNTER — Other Ambulatory Visit: Payer: Self-pay

## 2023-07-23 ENCOUNTER — Encounter (HOSPITAL_BASED_OUTPATIENT_CLINIC_OR_DEPARTMENT_OTHER): Payer: Self-pay

## 2023-07-23 ENCOUNTER — Emergency Department (HOSPITAL_BASED_OUTPATIENT_CLINIC_OR_DEPARTMENT_OTHER): Payer: 59 | Admitting: Radiology

## 2023-07-23 ENCOUNTER — Emergency Department (HOSPITAL_BASED_OUTPATIENT_CLINIC_OR_DEPARTMENT_OTHER): Payer: 59

## 2023-07-23 DIAGNOSIS — M79604 Pain in right leg: Secondary | ICD-10-CM | POA: Insufficient documentation

## 2023-07-23 NOTE — ED Triage Notes (Signed)
Pt complaining of right calf pain that started yesterday around mid morning. York Spaniel it feels like someone kicked her in the back of the leg. Is not hot to touch or swollen. Denies injury but she does say she is sedentary at work and she takes birthcontrol.

## 2023-07-23 NOTE — ED Provider Notes (Signed)
[place] EMERGENCY DEPARTMENT AT [facility] Provider Note   CSN: [id number] Arrival date & time: 07/23/23  2057     History  Chief Complaint  Patient presents with   Leg Pain    [name] is a 37 y.o. female.  Pt is a 37 yo female with pmhx significant for anxiety and seasonal allergies.  Pt said she has had right calf pain since yesterday.  She said it feels like someone kicked her in the calf.  She denies any injury.         Home Medications Prior to Admission medications   Medication Sig Start Date End Date Taking? Authorizing Provider  hyoscyamine (LEVSIN SL) 0.125 MG SL tablet Place 1 tablet (0.125 mg total) under the tongue every 4 (four) hours as needed. 07/16/22   Unk Lightning, PA  JUNEL 1.5/30 1.5-30 MG-MCG tablet Take 1 tablet by mouth daily. 06/15/21   [provider]  omeprazole (PRILOSEC) 40 MG capsule TAKE 1 CAPSULE BY MOUTH DAILY 07/04/23   Unk Lightning, PA  rosuvastatin (CRESTOR) 20 MG tablet Take 20 mg by mouth once a week. 04/10/21   [provider]      Allergies    Shellfish allergy    Review of Systems   Review of Systems  Musculoskeletal:        Right calf pain  All other systems reviewed and are negative.   Physical Exam Updated Vital Signs BP 125/82   Pulse 77   Temp 98.6 F (37 C) (Oral)   Resp 18   Ht 5\' 2"  (1.575 m)   Wt 64.4 kg   SpO2 97%   BMI 25.97 kg/m  Physical Exam Vitals and nursing note reviewed.  Constitutional:      Appearance: Normal appearance.  HENT:     Head: Normocephalic and atraumatic.     Right Ear: External ear normal.     Left Ear: External ear normal.     Nose: Nose normal.     Mouth/Throat:     Mouth: Mucous membranes are moist.     Pharynx: Oropharynx is clear.  Eyes:     Extraocular Movements: Extraocular movements intact.     Conjunctiva/sclera: Conjunctivae normal.     Pupils: Pupils are equal, round, and reactive to light.  Cardiovascular:      Rate and Rhythm: Normal rate and regular rhythm.     Pulses: Normal pulses.     Heart sounds: Normal heart sounds.  Pulmonary:     Effort: Pulmonary effort is normal.     Breath sounds: Normal breath sounds.  Abdominal:     General: Abdomen is flat. Bowel sounds are normal.     Palpations: Abdomen is soft.  Musculoskeletal:     Cervical back: Normal range of motion and neck supple.       Legs:  Skin:    General: Skin is warm.  Neurological:     Mental Status: She is alert.     ED Results / Procedures / Treatments   Labs (all labs ordered are listed, but only abnormal results are displayed) Labs Reviewed - No data to display  EKG None  Radiology No results found.  Procedures Procedures    Medications Ordered in ED Medications - No data to display  ED Course/ Medical Decision Making/ A&P  Medical Decision Making  This patient presents to the ED for concern of leg pain, this involves an extensive number of treatment options, and is a complaint that carries with it a high risk of complications and morbidity.  The differential diagnosis includes dvt, msk   Co morbidities that complicate the patient evaluation  anxiety   Additional history obtained:  Additional history obtained from epic chart review External records from outside source obtained and reviewed including family   Imaging Studies ordered:  I ordered imaging studies including [place] and XR  Pending at shift change  Test Considered:  [place]   Problem List / ED Course:  Calf pain:  [place] pending.  Xray pending.   Reevaluation:  After the interventions noted above, I reevaluated the patient and found that they have :improved   Social Determinants of Health:  Live at home   Dispostion:  Pending at shift change        Final Clinical Impression(s) / ED Diagnoses Final diagnoses:  Pain of right lower extremity    Rx / DC Orders ED Discharge  Orders     None         Jacalyn Lefevre, MD 07/23/23 2316

## 2023-07-24 NOTE — ED Notes (Signed)
Reviewed AVS with patient, patient expressed understanding of directions, denies further questions at this time. 

## 2023-07-24 NOTE — Discharge Instructions (Signed)
You were seen today for leg pain.  Your ultrasound is reassuring and without evidence of DVT as is your x-ray.  Take ibuprofen as needed for pain.

## 2023-07-24 NOTE — ED Provider Notes (Signed)
Patient signed out pending x-ray and DVT study.  X-ray DVT study are negative.  Patient updated.  Plan for NSAIDs and follow-up with PCP.   Shon Baton, MD 07/24/23 [phone]

## 2023-09-23 ENCOUNTER — Other Ambulatory Visit: Payer: Self-pay | Admitting: Physician Assistant

## 2024-03-09 ENCOUNTER — Other Ambulatory Visit: Payer: Self-pay | Admitting: Physician Assistant

## 2024-05-28 ENCOUNTER — Encounter: Payer: Self-pay | Admitting: Internal Medicine

## 2024-05-28 ENCOUNTER — Ambulatory Visit: Admitting: Internal Medicine

## 2024-05-28 VITALS — BP 126/72 | HR 75 | Ht 62.0 in | Wt 143.0 lb

## 2024-05-28 DIAGNOSIS — K219 Gastro-esophageal reflux disease without esophagitis: Secondary | ICD-10-CM

## 2024-05-28 MED ORDER — OMEPRAZOLE 40 MG PO CPDR: 40.0000 mg | DELAYED_RELEASE_CAPSULE | Freq: Every day | ORAL | 3 refills | Status: DC

## 2024-05-28 NOTE — Patient Instructions (Signed)
 _______________________________________________________ We have sent the following medications to your pharmacy for you to pick up at your convenience:  Omeprazole   Please follow up in one year.  If your blood pressure at your visit was 140/90 or greater, please contact your primary care physician to follow up on this.  _______________________________________________________  If you are age 39 or older, your body mass index should be between 23-30. Your Body mass index is 26.16 kg/m. If this is out of the aforementioned range listed, please consider follow up with your Primary Care Provider.  If you are age 48 or younger, your body mass index should be between 19-25. Your Body mass index is 26.16 kg/m. If this is out of the aformentioned range listed, please consider follow up with your Primary Care Provider.   ________________________________________________________  The [facility] providers would like to encourage you to use MYCHART to communicate with providers for non-urgent requests or questions.  Due to long hold times on the telephone, sending your provider a message by [facility] may be a faster and more efficient way to get a response.  Please allow 48 business hours for a response.  Please remember that this is for non-urgent requests.  _______________________________________________________  [facility] is using a team-based approach to care.  Your team is made up of your doctor and two to three APPS. Our APPS (Nurse Practitioners and Physician Assistants) work with your physician to ensure care continuity for you. They are fully qualified to address your health concerns and develop a treatment plan. They communicate directly with your gastroenterologist to care for you. Seeing the Advanced Practice Practitioners on your physician's team can help you by facilitating care more promptly, often allowing for earlier appointments, access to diagnostic testing, procedures, and  other specialty referrals. ;9

## 2024-05-28 NOTE — Progress Notes (Signed)
 HISTORY OF PRESENT ILLNESS:  [name] is a 38 y.o. female with chronic GERD who presents today for routine follow-up and request medication refill.  I last saw the patient in the office June 20, 2021.  See that dictation.  She underwent upper endoscopy November 2022.  Normal EGD.  Last seen in the office by the GI physician assistant November 2023 for follow-up.  He is dictation.  Patient states he is doing well.  Occasional breakthrough symptoms.  Overall omeprazole  40 mg daily is helpful.  No dysphagia GI review of systems is otherwise negative  REVIEW OF SYSTEMS:  All non-GI ROS negative. Past Medical History:  Diagnosis Date   Allergy    Anxiety     Past Surgical History:  Procedure Laterality Date   NO PAST SURGERIES      Social History [name]  reports that she has never smoked. She has never used smokeless tobacco. She reports that she does not currently use alcohol. She reports that she does not use drugs.  family history includes Crohn's disease in her mother; Glaucoma in her father; Hepatitis C in her father; Lung cancer in her paternal grandfather; Ovarian cancer in her maternal grandmother.  Allergies  Allergen Reactions   Shellfish Allergy Diarrhea       PHYSICAL EXAMINATION: Vital signs: BP 126/72   Pulse 75   Ht 5' 2 (1.575 m)   Wt 143 lb (64.9 kg)   BMI 26.16 kg/m   Constitutional: generally well-appearing, no acute distress Psychiatric: alert and oriented x3, cooperative Eyes: extraocular movements intact, anicteric, conjunctiva pink Mouth: oral pharynx moist, no lesions Neck: supple no lymphadenopathy Cardiovascular: heart regular rate and rhythm, no murmur Lungs: clear to auscultation bilaterally Abdomen: soft, nontender, nondistended, no obvious ascites, no peritoneal signs, normal bowel sounds, no organomegaly Rectal: Omitted Extremities: no lower extremity edema bilaterally Skin: no lesions on visible extremities Neuro: No focal  deficits. No asterixis.    ASSESSMENT:  1.  GERD.  Symptoms improved on PPI   PLAN:  1.  Reflux precautions 2.  Continue omeprazole  40 mg daily 3.  Prescribe omeprazole  40 mg daily.  Medication risks reviewed 4.  Routine follow-up 1 year

## 2024-05-29 ENCOUNTER — Telehealth: Payer: Self-pay | Admitting: Internal Medicine

## 2024-05-29 MED ORDER — HYOSCYAMINE SULFATE 0.125 MG SL SUBL: 0.1250 mg | SUBLINGUAL_TABLET | SUBLINGUAL | 3 refills | Status: AC | PRN

## 2024-05-29 NOTE — Telephone Encounter (Signed)
 PT is calling to request a refill on hyoscyamine  sent to [facility] [address].

## 2024-08-09 ENCOUNTER — Other Ambulatory Visit: Payer: Self-pay | Admitting: Physician Assistant

## 2024-08-09 ENCOUNTER — Other Ambulatory Visit: Payer: Self-pay | Admitting: Internal Medicine

## 2024-08-10 ENCOUNTER — Other Ambulatory Visit: Payer: Self-pay | Admitting: Internal Medicine

## 2024-08-10 MED ORDER — OMEPRAZOLE 40 MG PO CPDR: 40.0000 mg | DELAYED_RELEASE_CAPSULE | Freq: Every day | ORAL | 2 refills | Status: AC

## 2024-08-10 NOTE — Telephone Encounter (Signed)
 Sent Omeprazole  to [facility]
